# Patient Record
Sex: Male | Born: 1950 | Race: White | Hispanic: No | Marital: Married | Smoking: Former smoker
Health system: Southern US, Community
[De-identification: ages and names within clinical notes are randomized; demographics above are authoritative.]

## PROBLEM LIST (undated history)

## (undated) DIAGNOSIS — K219 Gastro-esophageal reflux disease without esophagitis: Secondary | ICD-10-CM

## (undated) DIAGNOSIS — Z8719 Personal history of other diseases of the digestive system: Secondary | ICD-10-CM

## (undated) DIAGNOSIS — J8410 Pulmonary fibrosis, unspecified: Secondary | ICD-10-CM

## (undated) DIAGNOSIS — C61 Malignant neoplasm of prostate: Secondary | ICD-10-CM

## (undated) DIAGNOSIS — E039 Hypothyroidism, unspecified: Secondary | ICD-10-CM

## (undated) HISTORY — PX: BRONCHOSCOPY: SUR163

## (undated) HISTORY — PX: BACK SURGERY: SHX140

---
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
## 2011-09-12 HISTORY — PX: TOTAL HIP ARTHROPLASTY: SHX124

## 2014-03-28 ENCOUNTER — Ambulatory Visit: Payer: Self-pay

## 2014-05-31 ENCOUNTER — Ambulatory Visit: Payer: Self-pay | Admitting: Emergency Medicine

## 2016-09-11 DIAGNOSIS — R42 Dizziness and giddiness: Secondary | ICD-10-CM

## 2016-09-11 HISTORY — DX: Dizziness and giddiness: R42

## 2018-08-26 ENCOUNTER — Encounter: Payer: Medicare Other | Attending: Internal Medicine

## 2018-08-26 ENCOUNTER — Other Ambulatory Visit: Payer: Self-pay

## 2018-08-26 VITALS — Ht 71.25 in | Wt 183.5 lb

## 2018-08-26 DIAGNOSIS — R0602 Shortness of breath: Secondary | ICD-10-CM | POA: Diagnosis not present

## 2018-08-26 DIAGNOSIS — J84112 Idiopathic pulmonary fibrosis: Secondary | ICD-10-CM | POA: Insufficient documentation

## 2018-08-26 NOTE — Progress Notes (Signed)
Daily Session Note  Patient Details  Name: Sekai Gitlin MRN: [id number] Date of Birth: 08/25/1951 Referring Provider:     Pulmonary Rehab from 08/26/2018 in [facility]  Referring Provider  Rosana Berger MD      Encounter Date: 08/26/2018  Check In: Session Check In - 08/26/18 1420      Check-In   Supervising physician immediately available to respond to emergencies  [facility] immediately available ER MD    Physician(s)  Dr. Joni Fears and Alfred Levins    Location  [facility]-Cardiac & Pulmonary Rehab    Staff Present  Justin Mend RCP,RRT,BSRT;Jessica Luan Pulling, MA, RCEP, CCRP, Exercise Physiologist    Medication changes reported      No    Fall or balance concerns reported     No    Warm-up and Cool-down  Not performed (comment)   medical evaluation   Resistance Training Performed  No    VAD Patient?  No    PAD/SET Patient?  No      Pain Assessment   Currently in Pain?  No/denies        Exercise Prescription Changes - 08/26/18 1500      Response to Exercise   Blood Pressure (Admit)  126/70    Blood Pressure (Exercise)  164/84    Blood Pressure (Exit)  126/72    Heart Rate (Admit)  107 bpm    Heart Rate (Exercise)  134 bpm    Heart Rate (Exit)  115 bpm    Oxygen Saturation (Admit)  95 %    Oxygen Saturation (Exercise)  87 %    Oxygen Saturation (Exit)  95 %    Rating of Perceived Exertion (Exercise)  12    Perceived Dyspnea (Exercise)  2    Symptoms  SOB    Comments  walk test results       Social History   Tobacco Use  Smoking Status Former Smoker  . Packs/day: 1.00  . Years: 30.00  . Pack years: 30.00  . Types: Cigarettes  . Last attempt to quit: 09/12/2007  . Years since quitting: 10.9  Smokeless Tobacco Former Systems developer  . Quit date: 09/11/2012    Goals Met:  Exercise tolerated well Personal goals reviewed Queuing for purse lip breathing No report of cardiac concerns or symptoms Strength training completed today  Goals Unmet:   Not Applicable  Comments:  6 Minute Walk    Row Name 08/26/18 1458         6 Minute Walk   Phase  Initial     Distance  1410 feet     Walk Time  6 minutes     # of Rest Breaks  0     MPH  2.67     METS  4.06     RPE  12     Perceived Dyspnea   2     VO2 Peak  14.22     Symptoms  Yes (comment)     Comments  SOB     Resting HR  107 bpm     Resting BP  126/70     Resting Oxygen Saturation   95 %     Exercise Oxygen Saturation  during 6 min walk  87 %     Max Ex. HR  134 bpm     Max Ex. BP  164/84     2 Minute Post BP  126/72       Interval HR   1  Minute HR  110     2 Minute HR  109     3 Minute HR  109     4 Minute HR  109     5 Minute HR  109     6 Minute HR  134     2 Minute Post HR  115     Interval Heart Rate?  Yes       Interval Oxygen   Interval Oxygen?  Yes     Baseline Oxygen Saturation %  95 %     1 Minute Oxygen Saturation %  92 %     1 Minute Liters of Oxygen  0 L Room Air     2 Minute Oxygen Saturation %  88 %     2 Minute Liters of Oxygen  0 L     3 Minute Oxygen Saturation %  88 %     3 Minute Liters of Oxygen  0 L     4 Minute Oxygen Saturation %  87 %     4 Minute Liters of Oxygen  0 L     5 Minute Oxygen Saturation %  88 %     5 Minute Liters of Oxygen  0 L     6 Minute Oxygen Saturation %  87 %     6 Minute Liters of Oxygen  0 L     2 Minute Post Oxygen Saturation %  95 %     2 Minute Post Liters of Oxygen  0 L       Service Time 1415-1500   Dr. Emily Filbert is Medical Director for [place] and [facility].

## 2018-08-26 NOTE — Patient Instructions (Signed)
Patient Instructions  Patient Details  Name: Caleb IvoryRobert Edward Townsend MRN: [id number] Date of Birth: 1950/12/20 Referring Provider:  Emmit PomfretSwaminathan, Aparna C, * Below are your personal goals for exercise, nutrition, and risk factors. Our goal is to help you stay on track towards obtaining and maintaining these goals. We will be discussing your progress on these goals with you throughout the program. Initial Exercise Prescription: Initial Exercise Prescription - 08/26/18 1500      Date of Initial Exercise RX and Referring Provider   Date  08/26/18    Referring Provider  Lance MorinSwaminathan, Aparna MD      Treadmill   MPH  2.5    Grade  1    Minutes  15    METs  3.26      Recumbant Elliptical   Level  2    RPM  50    Minutes  15    METs  3      T5 Nustep   Level  3    SPM  80    Minutes  15    METs  3.2      Prescription Details   Frequency (times per week)  3    Duration  Progress to 45 minutes of aerobic exercise without signs/symptoms of physical distress      Intensity   THRR 40-80% of Max Heartrate  128/143    Ratings of Perceived Exertion  11-13    Perceived Dyspnea  0-4      Progression   Progression  Continue to progress workloads to maintain intensity without signs/symptoms of physical distress.      Resistance Training   Training Prescription  Yes    Weight  4 lbs    Reps  10-15      Exercise Goals: Frequency: Be able to perform aerobic exercise two to three times per week in program working toward 2-5 days per week of home exercise.  Intensity: Work with a perceived exertion of 11 (fairly light) - 15 (hard) while following your exercise prescription.  We will make changes to your prescription with you as you progress through the program.   Duration: Be able to do 30 to 45 minutes of continuous aerobic exercise in addition to a 5 minute warm-up and a 5 minute cool-down routine. Nutrition Goals: Your personal nutrition goals will be established when you do your  nutrition analysis with the dietician. The following are general nutrition guidelines to follow: Cholesterol < 200mg /day Sodium < 1500mg /day Fiber: Men over 50 yrs - 30 grams per day Personal Goals: Personal Goals and Risk Factors at Admission - 08/26/18 1458      Core Components/Risk Factors/Patient Goals on Admission    Weight Management  Yes;Weight Loss    Intervention  Weight Management: Develop a combined nutrition and exercise program designed to reach desired caloric intake, while maintaining appropriate intake of nutrient and fiber, sodium and fats, and appropriate energy expenditure required for the weight goal.;Weight Management: Provide education and appropriate resources to help participant work on and attain dietary goals.;Weight Management/Obesity: Establish reasonable short term and long term weight goals.    Admit Weight  183 lb 8 oz (83.2 kg)    Goal Weight: Short Term  178 lb (80.7 kg)    Goal Weight: Long Term  170 lb (77.1 kg)    Expected Outcomes  Short Term: Continue to assess and modify interventions until short term weight is achieved;Long Term: Adherence to nutrition and physical activity/exercise program aimed toward attainment of  established weight goal;Weight Loss: Understanding of general recommendations for a balanced deficit meal plan, which promotes 1-2 lb weight loss per week and includes a negative energy balance of [phone] kcal/d;Understanding recommendations for meals to include 15-35% energy as protein, 25-35% energy from fat, 35-60% energy from carbohydrates, less than 200mg  of dietary cholesterol, 20-35 gm of total fiber daily;Understanding of distribution of calorie intake throughout the day with the consumption of 4-5 meals/snacks    Improve shortness of breath with ADL's  Yes    Intervention  Provide education, individualized exercise plan and daily activity instruction to help decrease symptoms of SOB with activities of daily living.    Expected Outcomes   Short Term: Improve cardiorespiratory fitness to achieve a reduction of symptoms when performing ADLs;Long Term: Be able to perform more ADLs without symptoms or delay the onset of symptoms      Tobacco Use Initial Evaluation: Social History   Tobacco Use  Smoking Status Former Smoker  . Packs/day: 1.00  . Years: 30.00  . Pack years: 30.00  . Types: Cigarettes  . Last attempt to quit: 09/12/2007  . Years since quitting: 10.9  Smokeless Tobacco Former Neurosurgeon  . Quit date: 09/11/2012   Exercise Goals and Review: Exercise Goals    Row Name 08/26/18 1502             Exercise Goals   Increase Physical Activity  Yes       Intervention  Provide advice, education, support and counseling about physical activity/exercise needs.;Develop an individualized exercise prescription for aerobic and resistive training based on initial evaluation findings, risk stratification, comorbidities and participant's personal goals.       Expected Outcomes  Short Term: Attend rehab on a regular basis to increase amount of physical activity.;Long Term: Add in home exercise to make exercise part of routine and to increase amount of physical activity.;Long Term: Exercising regularly at least 3-5 days a week.       Increase Strength and Stamina  Yes       Intervention  Provide advice, education, support and counseling about physical activity/exercise needs.;Develop an individualized exercise prescription for aerobic and resistive training based on initial evaluation findings, risk stratification, comorbidities and participant's personal goals.       Expected Outcomes  Short Term: Increase workloads from initial exercise prescription for resistance, speed, and METs.;Short Term: Perform resistance training exercises routinely during rehab and add in resistance training at home;Long Term: Improve cardiorespiratory fitness, muscular endurance and strength as measured by increased METs and functional capacity ( )       Able  to understand and use rate of perceived exertion (RPE) scale  Yes       Intervention  Provide education and explanation on how to use RPE scale       Expected Outcomes  Short Term: Able to use RPE daily in rehab to express subjective intensity level;Long Term:  Able to use RPE to guide intensity level when exercising independently       Knowledge and understanding of Target Heart Rate Range (THRR)  Yes       Intervention  Provide education and explanation of THRR including how the numbers were predicted and where they are located for reference       Expected Outcomes  Short Term: Able to state/look up THRR;Long Term: Able to use THRR to govern intensity when exercising independently;Short Term: Able to use daily as guideline for intensity in rehab       Able to  check pulse independently  Yes       Intervention  Provide education and demonstration on how to check pulse in carotid and radial arteries.;Review the importance of being able to check your own pulse for safety during independent exercise       Expected Outcomes  Short Term: Able to explain why pulse checking is important during independent exercise;Long Term: Able to check pulse independently and accurately       Understanding of Exercise Prescription  Yes       Intervention  Provide education, explanation, and written materials on patient's individual exercise prescription       Expected Outcomes  Short Term: Able to explain program exercise prescription;Long Term: Able to explain home exercise prescription to exercise independently         Copy of goals given to participant.

## 2018-08-26 NOTE — Progress Notes (Signed)
Pulmonary Individual Treatment Plan  Patient Details  Name: Caleb Townsend MRN: [id number] Date of Birth: Sep 02, 1951 Referring Provider:     Pulmonary Rehab from 08/26/2018 in [facility] Cardiac and Pulmonary Rehab  Referring Provider  Rosana Berger MD      Initial Encounter Date:    Pulmonary Rehab from 08/26/2018 in [facility] Cardiac and Pulmonary Rehab  Date  08/26/18      Visit Diagnosis: IPF (idiopathic pulmonary fibrosis) ([place])  Patient's Home Medications on Admission: No current outpatient medications on file.  Past Medical History: History reviewed. No pertinent past medical history.  Tobacco Use: Social History   Tobacco Use  Smoking Status Former Smoker  . Packs/day: 1.00  . Years: 30.00  . Pack years: 30.00  . Types: Cigarettes  . Last attempt to quit: 09/12/2007  . Years since quitting: 10.9  Smokeless Tobacco Former Systems developer  . Quit date: 09/11/2012    Labs: Recent Review Flowsheet Data    There is no flowsheet data to display.       Pulmonary Assessment Scores: Pulmonary Assessment Scores    Row Name 08/26/18 1431         ADL UCSD   ADL Phase  Entry     SOB Score total  51     Rest  0     Walk  2     Stairs  4     Bath  3     Dress  2     Shop  1       CAT Score   CAT Score  24       mMRC Score   mMRC Score  2        Pulmonary Function Assessment: Pulmonary Function Assessment - 08/26/18 1452      Breath   Bilateral Breath Sounds  Clear;Decreased    Shortness of Breath  Yes;Limiting activity       Exercise Target Goals: Exercise Program Goal: Individual exercise prescription set using results from initial 6 min walk test and THRR while considering  patient's activity barriers and safety.   Exercise Prescription Goal: Initial exercise prescription builds to 30-45 minutes a day of aerobic activity, 2-3 days per week.  Home exercise guidelines will be given to patient during program as part of exercise prescription that the  participant will acknowledge.  Activity Barriers & Risk Stratification: Activity Barriers & Cardiac Risk Stratification - 08/26/18 1459      Activity Barriers & Cardiac Risk Stratification   Activity Barriers  Deconditioning;Shortness of Breath;Back Problems   previous back surgery x2      6 Minute Walk: 6 Minute Walk    Row Name 08/26/18 1458         6 Minute Walk   Phase  Initial     Distance  1410 feet     Walk Time  6 minutes     # of Rest Breaks  0     MPH  2.67     METS  4.06     RPE  12     Perceived Dyspnea   2     VO2 Peak  14.22     Symptoms  Yes (comment)     Comments  SOB     Resting HR  107 bpm     Resting BP  126/70     Resting Oxygen Saturation   95 %     Exercise Oxygen Saturation  during 6 min walk  87 %  Max Ex. HR  134 bpm     Max Ex. BP  164/84     2 Minute Post BP  126/72       Interval HR   1 Minute HR  110     2 Minute HR  109     3 Minute HR  109     4 Minute HR  109     5 Minute HR  109     6 Minute HR  134     2 Minute Post HR  115     Interval Heart Rate?  Yes       Interval Oxygen   Interval Oxygen?  Yes     Baseline Oxygen Saturation %  95 %     1 Minute Oxygen Saturation %  92 %     1 Minute Liters of Oxygen  0 L Room Air     2 Minute Oxygen Saturation %  88 %     2 Minute Liters of Oxygen  0 L     3 Minute Oxygen Saturation %  88 %     3 Minute Liters of Oxygen  0 L     4 Minute Oxygen Saturation %  87 %     4 Minute Liters of Oxygen  0 L     5 Minute Oxygen Saturation %  88 %     5 Minute Liters of Oxygen  0 L     6 Minute Oxygen Saturation %  87 %     6 Minute Liters of Oxygen  0 L     2 Minute Post Oxygen Saturation %  95 %     2 Minute Post Liters of Oxygen  0 L       Oxygen Initial Assessment: Oxygen Initial Assessment - 08/26/18 1452      Home Oxygen   Home Oxygen Device  None    Sleep Oxygen Prescription  None    Home Exercise Oxygen Prescription  None    Home at Rest Exercise Oxygen Prescription  None       Initial 6 min Walk   Oxygen Used  None      Program Oxygen Prescription   Program Oxygen Prescription  None      Intervention   Short Term Goals  To learn and demonstrate proper use of respiratory medications;To learn and demonstrate proper pursed lip breathing techniques or other breathing techniques.;To learn and understand importance of maintaining oxygen saturations>88%;To learn and understand importance of monitoring SPO2 with pulse oximeter and demonstrate accurate use of the pulse oximeter.    Long  Term Goals  Maintenance of O2 saturations>88%;Verbalizes importance of monitoring SPO2 with pulse oximeter and return demonstration;Exhibits proper breathing techniques, such as pursed lip breathing or other method taught during program session;Compliance with respiratory medication;Demonstrates proper use of MDI's       Oxygen Re-Evaluation:   Oxygen Discharge (Final Oxygen Re-Evaluation):   Initial Exercise Prescription: Initial Exercise Prescription - 08/26/18 1500      Date of Initial Exercise RX and Referring Provider   Date  08/26/18    Referring Provider  Rosana Berger MD      Treadmill   MPH  2.5    Grade  1    Minutes  15    METs  3.26      Recumbant Elliptical   Level  2    RPM  50    Minutes  15  METs  3      T5 Nustep   Level  3    SPM  80    Minutes  15    METs  3.2      Prescription Details   Frequency (times per week)  3    Duration  Progress to 45 minutes of aerobic exercise without signs/symptoms of physical distress      Intensity   THRR 40-80% of Max Heartrate  128/143    Ratings of Perceived Exertion  11-13    Perceived Dyspnea  0-4      Progression   Progression  Continue to progress workloads to maintain intensity without signs/symptoms of physical distress.      Resistance Training   Training Prescription  Yes    Weight  4 lbs    Reps  10-15       Perform Capillary Blood Glucose checks as needed.  Exercise  Prescription Changes: Exercise Prescription Changes    Row Name 08/26/18 1500             Response to Exercise   Blood Pressure (Admit)  126/70       Blood Pressure (Exercise)  164/84       Blood Pressure (Exit)  126/72       Heart Rate (Admit)  107 bpm       Heart Rate (Exercise)  134 bpm       Heart Rate (Exit)  115 bpm       Oxygen Saturation (Admit)  95 %       Oxygen Saturation (Exercise)  87 %       Oxygen Saturation (Exit)  95 %       Rating of Perceived Exertion (Exercise)  12       Perceived Dyspnea (Exercise)  2       Symptoms  SOB       Comments  walk test results          Exercise Comments:   Exercise Goals and Review: Exercise Goals    Row Name 08/26/18 1502             Exercise Goals   Increase Physical Activity  Yes       Intervention  Provide advice, education, support and counseling about physical activity/exercise needs.;Develop an individualized exercise prescription for aerobic and resistive training based on initial evaluation findings, risk stratification, comorbidities and participant's personal goals.       Expected Outcomes  Short Term: Attend rehab on a regular basis to increase amount of physical activity.;Long Term: Add in home exercise to make exercise part of routine and to increase amount of physical activity.;Long Term: Exercising regularly at least 3-5 days a week.       Increase Strength and Stamina  Yes       Intervention  Provide advice, education, support and counseling about physical activity/exercise needs.;Develop an individualized exercise prescription for aerobic and resistive training based on initial evaluation findings, risk stratification, comorbidities and participant's personal goals.       Expected Outcomes  Short Term: Increase workloads from initial exercise prescription for resistance, speed, and METs.;Short Term: Perform resistance training exercises routinely during rehab and add in resistance training at home;Long Term:  Improve cardiorespiratory fitness, muscular endurance and strength as measured by increased METs and functional capacity (6MWT)       Able to understand and use rate of perceived exertion (RPE) scale  Yes       Intervention  Provide education and explanation on how to use RPE scale       Expected Outcomes  Short Term: Able to use RPE daily in rehab to express subjective intensity level;Long Term:  Able to use RPE to guide intensity level when exercising independently       Knowledge and understanding of Target Heart Rate Range (THRR)  Yes       Intervention  Provide education and explanation of THRR including how the numbers were predicted and where they are located for reference       Expected Outcomes  Short Term: Able to state/look up THRR;Long Term: Able to use THRR to govern intensity when exercising independently;Short Term: Able to use daily as guideline for intensity in rehab       Able to check pulse independently  Yes       Intervention  Provide education and demonstration on how to check pulse in carotid and radial arteries.;Review the importance of being able to check your own pulse for safety during independent exercise       Expected Outcomes  Short Term: Able to explain why pulse checking is important during independent exercise;Long Term: Able to check pulse independently and accurately       Understanding of Exercise Prescription  Yes       Intervention  Provide education, explanation, and written materials on patient's individual exercise prescription       Expected Outcomes  Short Term: Able to explain program exercise prescription;Long Term: Able to explain home exercise prescription to exercise independently          Exercise Goals Re-Evaluation :   Discharge Exercise Prescription (Final Exercise Prescription Changes): Exercise Prescription Changes - 08/26/18 1500      Response to Exercise   Blood Pressure (Admit)  126/70    Blood Pressure (Exercise)  164/84    Blood  Pressure (Exit)  126/72    Heart Rate (Admit)  107 bpm    Heart Rate (Exercise)  134 bpm    Heart Rate (Exit)  115 bpm    Oxygen Saturation (Admit)  95 %    Oxygen Saturation (Exercise)  87 %    Oxygen Saturation (Exit)  95 %    Rating of Perceived Exertion (Exercise)  12    Perceived Dyspnea (Exercise)  2    Symptoms  SOB    Comments  walk test results       Nutrition:  Target Goals: Understanding of nutrition guidelines, daily intake of sodium <1556m, cholesterol <2078m calories 30% from fat and 7% or less from saturated fats, daily to have 5 or more servings of fruits and vegetables.  Biometrics: Pre Biometrics - 08/26/18 1502      Pre Biometrics   Height  5' 11.25" (1.81 m)    Weight  183 lb 8 oz (83.2 kg)    Waist Circumference  34 inches    Hip Circumference  39 inches    Waist to Hip Ratio  0.87 %    BMI (Calculated)  25.41        Nutrition Therapy Plan and Nutrition Goals: Nutrition Therapy & Goals - 08/26/18 1455      Personal Nutrition Goals   Nutrition Goal  Lose a little weight    Personal Goal #2  Eat healtier    Comments  He would like to meet with the dietician. He states he eats fairly well. He has to eat with his medications and his appetite is good.  Intervention Plan   Intervention  Prescribe, educate and counsel regarding individualized specific dietary modifications aiming towards targeted core components such as weight, hypertension, lipid management, diabetes, heart failure and other comorbidities.;Nutrition handout(s) given to patient.    Expected Outcomes  Short Term Goal: Understand basic principles of dietary content, such as calories, fat, sodium, cholesterol and nutrients.;Long Term Goal: Adherence to prescribed nutrition plan.       Nutrition Assessments: Nutrition Assessments - 08/26/18 1434      MEDFICTS Scores   Pre Score  43       Nutrition Goals Re-Evaluation:   Nutrition Goals Discharge (Final Nutrition Goals  Re-Evaluation):   Psychosocial: Target Goals: Acknowledge presence or absence of significant depression and/or stress, maximize coping skills, provide positive support system. Participant is able to verbalize types and ability to use techniques and skills needed for reducing stress and depression.   Initial Review & Psychosocial Screening: Initial Psych Review & Screening - 08/26/18 1453      Initial Review   Current issues with  None Identified      Family Dynamics   Good Support System?  Yes    Comments  He can look to his wife,2 daughters for support and close friends.      Barriers   Psychosocial barriers to participate in program  There are no identifiable barriers or psychosocial needs.;The patient should benefit from training in stress management and relaxation.      Screening Interventions   Interventions  Encouraged to exercise;Program counselor consult;To provide support and resources with identified psychosocial needs;Provide feedback about the scores to participant    Expected Outcomes  Short Term goal: Utilizing psychosocial counselor, staff and physician to assist with identification of specific Stressors or current issues interfering with healing process. Setting desired goal for each stressor or current issue identified.;Long Term Goal: Stressors or current issues are controlled or eliminated.;Short Term goal: Identification and review with participant of any Quality of Life or Depression concerns found by scoring the questionnaire.;Long Term goal: The participant improves quality of Life and PHQ9 Scores as seen by post scores and/or verbalization of changes       Quality of Life Scores:  Scores of 19 and below usually indicate a poorer quality of life in these areas.  A difference of  2-3 points is a clinically meaningful difference.  A difference of 2-3 points in the total score of the Quality of Life Index has been associated with significant improvement in overall  quality of life, self-image, physical symptoms, and general health in studies assessing change in quality of life.  PHQ-9: Recent Review Flowsheet Data    Depression screen [facility] 2/9 08/26/2018   Decreased Interest 1   Down, Depressed, Hopeless 0   PHQ - 2 Score 1   Altered sleeping 1   Tired, decreased energy 2   Change in appetite 0   Feeling bad or failure about yourself  0   Trouble concentrating 0   Moving slowly or fidgety/restless 0   Suicidal thoughts 0   PHQ-9 Score 4   Difficult doing work/chores Somewhat difficult     Interpretation of Total Score  Total Score Depression Severity:  1-4 = Minimal depression, 5-9 = Mild depression, 10-14 = Moderate depression, 15-19 = Moderately severe depression, 20-27 = Severe depression   Psychosocial Evaluation and Intervention:   Psychosocial Re-Evaluation:   Psychosocial Discharge (Final Psychosocial Re-Evaluation):   Education: Education Goals: Education classes will be provided on a weekly basis, covering required  topics. Participant will state understanding/return demonstration of topics presented.  Learning Barriers/Preferences: Learning Barriers/Preferences - 08/26/18 1456      Learning Barriers/Preferences   Learning Barriers  None    Learning Preferences  None       Education Topics:  Initial Evaluation Education: - Verbal, written and demonstration of respiratory meds, oximetry and breathing techniques. Instruction on use of nebulizers and MDIs and importance of monitoring MDI activations.   Pulmonary Rehab from 08/26/2018 in [facility] Cardiac and Pulmonary Rehab  Date  08/26/18  Educator  [facility]  Instruction Review Code  1- Verbalizes Understanding      General Nutrition Guidelines/Fats and Fiber: -Group instruction provided by verbal, written material, models and posters to present the general guidelines for heart healthy nutrition. Gives an explanation and review of dietary fats and fiber.   Controlling  Sodium/Reading Food Labels: -Group verbal and written material supporting the discussion of sodium use in heart healthy nutrition. Review and explanation with models, verbal and written materials for utilization of the food label.   Exercise Physiology & General Exercise Guidelines: - Group verbal and written instruction with models to review the exercise physiology of the cardiovascular system and associated critical values. Provides general exercise guidelines with specific guidelines to those with heart or lung disease.    Aerobic Exercise & Resistance Training: - Gives group verbal and written instruction on the various components of exercise. Focuses on aerobic and resistive training programs and the benefits of this training and how to safely progress through these programs.   Flexibility, Balance, Mind/Body Relaxation: Provides group verbal/written instruction on the benefits of flexibility and balance training, including mind/body exercise modes such as yoga, pilates and tai chi.  Demonstration and skill practice provided.   Stress and Anxiety: - Provides group verbal and written instruction about the health risks of elevated stress and causes of high stress.  Discuss the correlation between heart/lung disease and anxiety and treatment options. Review healthy ways to manage with stress and anxiety.   Depression: - Provides group verbal and written instruction on the correlation between heart/lung disease and depressed mood, treatment options, and the stigmas associated with seeking treatment.   Exercise & Equipment Safety: - Individual verbal instruction and demonstration of equipment use and safety with use of the equipment.   Pulmonary Rehab from 08/26/2018 in [facility] Cardiac and Pulmonary Rehab  Date  08/26/18  Educator  [facility]  Instruction Review Code  1- Verbalizes Understanding      Infection Prevention: - Provides verbal and written material to individual with discussion of  infection control including proper hand washing and proper equipment cleaning during exercise session.   Pulmonary Rehab from 08/26/2018 in [facility] Cardiac and Pulmonary Rehab  Date  08/26/18  Educator  [facility]  Instruction Review Code  1- Verbalizes Understanding      Falls Prevention: - Provides verbal and written material to individual with discussion of falls prevention and safety.   Pulmonary Rehab from 08/26/2018 in [facility] Cardiac and Pulmonary Rehab  Date  08/26/18  Educator  [facility]  Instruction Review Code  1- Verbalizes Understanding      Diabetes: - Individual verbal and written instruction to review signs/symptoms of diabetes, desired ranges of glucose level fasting, after meals and with exercise. Advice that pre and post exercise glucose checks will be done for 3 sessions at entry of program.   Chronic Lung Diseases: - Group verbal and written instruction to review updates, respiratory medications, advancements in procedures and treatments. Discuss use  of supplemental oxygen including available portable oxygen systems, continuous and intermittent flow rates, concentrators, personal use and safety guidelines. Review proper use of inhaler and spacers. Provide informative websites for self-education.    Energy Conservation: - Provide group verbal and written instruction for methods to conserve energy, plan and organize activities. Instruct on pacing techniques, use of adaptive equipment and posture/positioning to relieve shortness of breath.   Triggers and Exacerbations: - Group verbal and written instruction to review types of environmental triggers and ways to prevent exacerbations. Discuss weather changes, air quality and the benefits of nasal washing. Review warning signs and symptoms to help prevent infections. Discuss techniques for effective airway clearance, coughing, and vibrations.   AED/CPR: - Group verbal and written instruction with the use of models to demonstrate the basic  use of the AED with the basic ABC's of resuscitation.   Anatomy and Physiology of the Lungs: - Group verbal and written instruction with the use of models to provide basic lung anatomy and physiology related to function, structure and complications of lung disease.   Anatomy & Physiology of the Heart: - Group verbal and written instruction and models provide basic cardiac anatomy and physiology, with the coronary electrical and arterial systems. Review of Valvular disease and Heart Failure   Cardiac Medications: - Group verbal and written instruction to review commonly prescribed medications for heart disease. Reviews the medication, class of the drug, and side effects.   Know Your Numbers and Risk Factors: -Group verbal and written instruction about important numbers in your health.  Discussion of what are risk factors and how they play a role in the disease process.  Review of Cholesterol, Blood Pressure, Diabetes, and BMI and the role they play in your overall health.   Sleep Hygiene: -Provides group verbal and written instruction about how sleep can affect your health.  Define sleep hygiene, discuss sleep cycles and impact of sleep habits. Review good sleep hygiene tips.    Other: -Provides group and verbal instruction on various topics (see comments)    Knowledge Questionnaire Score: Knowledge Questionnaire Score - 08/26/18 1433      Knowledge Questionnaire Score   Pre Score  15/18   reviewed with patient       Core Components/Risk Factors/Patient Goals at Admission: Personal Goals and Risk Factors at Admission - 08/26/18 1458      Core Components/Risk Factors/Patient Goals on Admission    Weight Management  Yes;Weight Loss    Intervention  Weight Management: Develop a combined nutrition and exercise program designed to reach desired caloric intake, while maintaining appropriate intake of nutrient and fiber, sodium and fats, and appropriate energy expenditure required  for the weight goal.;Weight Management: Provide education and appropriate resources to help participant work on and attain dietary goals.;Weight Management/Obesity: Establish reasonable short term and long term weight goals.    Admit Weight  183 lb 8 oz (83.2 kg)    Goal Weight: Short Term  178 lb (80.7 kg)    Goal Weight: Long Term  170 lb (77.1 kg)    Expected Outcomes  Short Term: Continue to assess and modify interventions until short term weight is achieved;Long Term: Adherence to nutrition and physical activity/exercise program aimed toward attainment of established weight goal;Weight Loss: Understanding of general recommendations for a balanced deficit meal plan, which promotes 1-2 lb weight loss per week and includes a negative energy balance of [phone] kcal/d;Understanding recommendations for meals to include 15-35% energy as protein, 25-35% energy from fat, 35-60% energy  from carbohydrates, less than 259m of dietary cholesterol, 20-35 gm of total fiber daily;Understanding of distribution of calorie intake throughout the day with the consumption of 4-5 meals/snacks    Improve shortness of breath with ADL's  Yes    Intervention  Provide education, individualized exercise plan and daily activity instruction to help decrease symptoms of SOB with activities of daily living.    Expected Outcomes  Short Term: Improve cardiorespiratory fitness to achieve a reduction of symptoms when performing ADLs;Long Term: Be able to perform more ADLs without symptoms or delay the onset of symptoms       Core Components/Risk Factors/Patient Goals Review:    Core Components/Risk Factors/Patient Goals at Discharge (Final Review):    ITP Comments: ITP Comments    Row Name 08/26/18 1425           ITP Comments  Medical Evaluation completed. Chart sent for review and changes to Dr. MEmily FilbertDirector of [place]asa [place] Diagnosis can be found in Care everywhere 08/15/18          Comments: Initial ITP

## 2018-09-16 ENCOUNTER — Encounter: Payer: Medicare Other | Attending: Internal Medicine | Admitting: *Deleted

## 2018-09-16 DIAGNOSIS — J84112 Idiopathic pulmonary fibrosis: Secondary | ICD-10-CM | POA: Diagnosis not present

## 2018-09-16 DIAGNOSIS — R0602 Shortness of breath: Secondary | ICD-10-CM | POA: Diagnosis not present

## 2018-09-16 NOTE — Progress Notes (Signed)
Daily Session Note  Patient Details  Name: Caleb Townsend MRN: [id number] Date of Birth: 02/07/1951 Referring Provider:     Pulmonary Rehab from 08/26/2018 in [facility] Cardiac and Pulmonary Rehab  Referring Provider  Rosana Berger MD      Encounter Date: 09/16/2018  Check In: Session Check In - 09/16/18 1022      Check-In   Supervising physician immediately available to respond to emergencies  [facility] immediately available ER MD    Physician(s)  Dr. Joni Fears and Dr. Kerman Passey    Location  [facility]-Cardiac & Pulmonary Rehab    Staff Present  Earlean Shawl, BS, ACSM CEP, Exercise Physiologist;Joseph [facility], [place], ACSM CEP, Exercise Physiologist    Medication changes reported      No    Fall or balance concerns reported     No    Tobacco Cessation  No Change    Warm-up and Cool-down  Performed as group-led instruction    Resistance Training Performed  Yes    VAD Patient?  No    PAD/SET Patient?  No      Pain Assessment   Currently in Pain?  No/denies    Multiple Pain Sites  No          Social History   Tobacco Use  Smoking Status Former Smoker  . Packs/day: 1.00  . Years: 30.00  . Pack years: 30.00  . Types: Cigarettes  . Last attempt to quit: 09/12/2007  . Years since quitting: 11.0  Smokeless Tobacco Former Systems developer  . Quit date: 09/11/2012    Goals Met:  Proper associated with RPD/PD & O2 Sat Exercise tolerated well Personal goals reviewed No report of cardiac concerns or symptoms Strength training completed today  Goals Unmet:  Not Applicable  Comments: First full day of exercise!  Patient was oriented to gym and equipment including functions, settings, policies, and procedures.  Patient's individual exercise prescription and treatment plan were reviewed.  All starting workloads were established based on the results of the 6 minute walk test done at initial orientation visit.  The plan for exercise progression was also introduced and  progression will be customized based on patient's performance and goals.    Dr. Emily Filbert is Medical Director for [place] and [facility].

## 2018-09-18 DIAGNOSIS — J84112 Idiopathic pulmonary fibrosis: Secondary | ICD-10-CM

## 2018-09-18 DIAGNOSIS — R0602 Shortness of breath: Secondary | ICD-10-CM | POA: Diagnosis not present

## 2018-09-18 NOTE — Progress Notes (Signed)
Daily Session Note  Patient Details  Name: Caleb Townsend MRN: [id number] Date of Birth: [birth year] Referring Provider:     Pulmonary Rehab from 08/26/2018 in [facility] Cardiac and Pulmonary Rehab  Referring Provider  Rosana Berger MD      Encounter Date: 09/18/2018  Check In: Session Check In - 09/18/18 1025      Check-In   Supervising physician immediately available to respond to emergencies  [facility] immediately available ER MD    Physician(s)  Jimmye Norman and Mariea Clonts    Location  [facility]-Cardiac & Pulmonary Rehab    Staff Present  Justin Mend RCP,RRT,BSRT;Amanda Oletta Darter, BA, ACSM CEP, Exercise Physiologist;Jessica Luan Pulling, MA, RCEP, CCRP, Exercise Physiologist    Medication changes reported      No    Fall or balance concerns reported     No    Warm-up and Cool-down  Performed as group-led instruction    Resistance Training Performed  Yes    VAD Patient?  No    PAD/SET Patient?  No      Pain Assessment   Currently in Pain?  No/denies    Multiple Pain Sites  No          Social History   Tobacco Use  Smoking Status Former Smoker  . Packs/day: 1.00  . Years: 30.00  . Pack years: 30.00  . Types: Cigarettes  . Last attempt to quit: 09/12/2007  . Years since quitting: 11.0  Smokeless Tobacco Former Systems developer  . Quit date: 09/11/2012    Goals Met:  Proper associated with RPD/PD & O2 Sat Independence with exercise equipment Exercise tolerated well Strength training completed today  Goals Unmet:  Not Applicable  Comments: Pt able to follow exercise prescription today without complaint.  Will continue to monitor for progression.    Dr. Emily Filbert is Medical Director for [place] and [facility].

## 2018-09-23 ENCOUNTER — Encounter: Payer: Medicare Other | Admitting: *Deleted

## 2018-09-23 DIAGNOSIS — R0602 Shortness of breath: Secondary | ICD-10-CM | POA: Diagnosis not present

## 2018-09-23 DIAGNOSIS — J84112 Idiopathic pulmonary fibrosis: Secondary | ICD-10-CM

## 2018-09-23 NOTE — Progress Notes (Signed)
Daily Session Note  Patient Details  Name: Caleb Townsend MRN: [id number] Date of Birth: [birth year] Referring Provider:     Pulmonary Rehab from 08/26/2018 in [facility] Cardiac and Pulmonary Rehab  Referring Provider  Rosana Berger MD      Encounter Date: 09/23/2018  Check In: Session Check In - 09/23/18 1026      Check-In   Supervising physician immediately available to respond to emergencies  [facility] immediately available ER MD    Physician(s)  Dr. Kerman Passey Dr. Cinda Quest    Location  [facility]-Cardiac & Pulmonary Rehab    Staff Present  Earlean Shawl, BS, ACSM CEP, Exercise Physiologist;Joseph [facility], [place], ACSM CEP, Exercise Physiologist    Medication changes reported      No    Fall or balance concerns reported     No    Tobacco Cessation  No Change    Warm-up and Cool-down  Performed as group-led instruction    Resistance Training Performed  Yes    VAD Patient?  No    PAD/SET Patient?  No      Pain Assessment   Currently in Pain?  No/denies    Multiple Pain Sites  No          Social History   Tobacco Use  Smoking Status Former Smoker  . Packs/day: 1.00  . Years: 30.00  . Pack years: 30.00  . Types: Cigarettes  . Last attempt to quit: 09/12/2007  . Years since quitting: 11.0  Smokeless Tobacco Former Systems developer  . Quit date: 09/11/2012    Goals Met:  Proper associated with RPD/PD & O2 Sat Independence with exercise equipment Personal goals reviewed No report of cardiac concerns or symptoms Strength training completed today  Goals Unmet:  Not Applicable  Comments: Pt able to follow exercise prescription today without complaint.  Will continue to monitor for progression.    Dr. Emily Filbert is Medical Director for [place] and [facility].

## 2018-09-23 NOTE — Progress Notes (Signed)
Pulmonary Individual Treatment Plan  Patient Details  Name: Caleb Townsend MRN: [id number] Date of Birth: 1951-08-18 Referring Provider:     Pulmonary Rehab from 08/26/2018 in [facility] Cardiac and Pulmonary Rehab  Referring Provider  Rosana Berger MD      Initial Encounter Date:    Pulmonary Rehab from 08/26/2018 in [facility] Cardiac and Pulmonary Rehab  Date  08/26/18      Visit Diagnosis: No diagnosis found.  Patient's Home Medications on Admission: No current outpatient medications on file.  Past Medical History: No past medical history on file.  Tobacco Use: Social History   Tobacco Use  Smoking Status Former Smoker  . Packs/day: 1.00  . Years: 30.00  . Pack years: 30.00  . Types: Cigarettes  . Last attempt to quit: 09/12/2007  . Years since quitting: 11.0  Smokeless Tobacco Former Systems developer  . Quit date: 09/11/2012    Labs: Recent Review Flowsheet Data    There is no flowsheet data to display.       Pulmonary Assessment Scores: Pulmonary Assessment Scores    Row Name 08/26/18 1431         ADL UCSD   ADL Phase  Entry     SOB Score total  51     Rest  0     Walk  2     Stairs  4     Bath  3     Dress  2     Shop  1       CAT Score   CAT Score  24       mMRC Score   mMRC Score  2        Pulmonary Function Assessment: Pulmonary Function Assessment - 08/26/18 1452      Breath   Bilateral Breath Sounds  Clear;Decreased    Shortness of Breath  Yes;Limiting activity       Exercise Target Goals: Exercise Program Goal: Individual exercise prescription set using results from initial 6 min walk test and THRR while considering  patient's activity barriers and safety.   Exercise Prescription Goal: Initial exercise prescription builds to 30-45 minutes a day of aerobic activity, 2-3 days per week.  Home exercise guidelines will be given to patient during program as part of exercise prescription that the participant will acknowledge.  Activity  Barriers & Risk Stratification: Activity Barriers & Cardiac Risk Stratification - 08/26/18 1459      Activity Barriers & Cardiac Risk Stratification   Activity Barriers  Deconditioning;Shortness of Breath;Back Problems   previous back surgery x2      6 Minute Walk: 6 Minute Walk    Row Name 08/26/18 1458         6 Minute Walk   Phase  Initial     Distance  1410 feet     Walk Time  6 minutes     # of Rest Breaks  0     MPH  2.67     METS  4.06     RPE  12     Perceived Dyspnea   2     VO2 Peak  14.22     Symptoms  Yes (comment)     Comments  SOB     Resting HR  107 bpm     Resting BP  126/70     Resting Oxygen Saturation   95 %     Exercise Oxygen Saturation  during 6 min walk  87 %  Max Ex. HR  134 bpm     Max Ex. BP  164/84     2 Minute Post BP  126/72       Interval HR   1 Minute HR  110     2 Minute HR  109     3 Minute HR  109     4 Minute HR  109     5 Minute HR  109     6 Minute HR  134     2 Minute Post HR  115     Interval Heart Rate?  Yes       Interval Oxygen   Interval Oxygen?  Yes     Baseline Oxygen Saturation %  95 %     1 Minute Oxygen Saturation %  92 %     1 Minute Liters of Oxygen  0 L Room Air     2 Minute Oxygen Saturation %  88 %     2 Minute Liters of Oxygen  0 L     3 Minute Oxygen Saturation %  88 %     3 Minute Liters of Oxygen  0 L     4 Minute Oxygen Saturation %  87 %     4 Minute Liters of Oxygen  0 L     5 Minute Oxygen Saturation %  88 %     5 Minute Liters of Oxygen  0 L     6 Minute Oxygen Saturation %  87 %     6 Minute Liters of Oxygen  0 L     2 Minute Post Oxygen Saturation %  95 %     2 Minute Post Liters of Oxygen  0 L       Oxygen Initial Assessment: Oxygen Initial Assessment - 08/26/18 1452      Home Oxygen   Home Oxygen Device  None    Sleep Oxygen Prescription  None    Home Exercise Oxygen Prescription  None    Home at Rest Exercise Oxygen Prescription  None      Initial 6 min Walk   Oxygen Used   None      Program Oxygen Prescription   Program Oxygen Prescription  None      Intervention   Short Term Goals  To learn and demonstrate proper use of respiratory medications;To learn and demonstrate proper pursed lip breathing techniques or other breathing techniques.;To learn and understand importance of maintaining oxygen saturations>88%;To learn and understand importance of monitoring SPO2 with pulse oximeter and demonstrate accurate use of the pulse oximeter.    Long  Term Goals  Maintenance of O2 saturations>88%;Verbalizes importance of monitoring SPO2 with pulse oximeter and return demonstration;Exhibits proper breathing techniques, such as pursed lip breathing or other method taught during program session;Compliance with respiratory medication;Demonstrates proper use of MDI's       Oxygen Re-Evaluation: Oxygen Re-Evaluation    Row Name 09/16/18 1025             Program Oxygen Prescription   Program Oxygen Prescription  None         Home Oxygen   Home Oxygen Device  None       Sleep Oxygen Prescription  None       Home Exercise Oxygen Prescription  None       Home at Rest Exercise Oxygen Prescription  None         Goals/Expected Outcomes   Short Term Goals  To learn and demonstrate proper use of respiratory medications;To learn and demonstrate proper pursed lip breathing techniques or other breathing techniques.;To learn and understand importance of maintaining oxygen saturations>88%;To learn and understand importance of monitoring SPO2 with pulse oximeter and demonstrate accurate use of the pulse oximeter.       Long  Term Goals  Maintenance of O2 saturations>88%;Verbalizes importance of monitoring SPO2 with pulse oximeter and return demonstration;Exhibits proper breathing techniques, such as pursed lip breathing or other method taught during program session;Compliance with respiratory medication;Demonstrates proper use of MDI's       Comments  Reviewed PLB technique with pt.   Talked about how it work and it's important to maintaining his exercise saturations.         Goals/Expected Outcomes  Short: Become more profiecient at using PLB.   Long: Become independent at using PLB.          Oxygen Discharge (Final Oxygen Re-Evaluation): Oxygen Re-Evaluation - 09/16/18 1025      Program Oxygen Prescription   Program Oxygen Prescription  None      Home Oxygen   Home Oxygen Device  None    Sleep Oxygen Prescription  None    Home Exercise Oxygen Prescription  None    Home at Rest Exercise Oxygen Prescription  None      Goals/Expected Outcomes   Short Term Goals  To learn and demonstrate proper use of respiratory medications;To learn and demonstrate proper pursed lip breathing techniques or other breathing techniques.;To learn and understand importance of maintaining oxygen saturations>88%;To learn and understand importance of monitoring SPO2 with pulse oximeter and demonstrate accurate use of the pulse oximeter.    Long  Term Goals  Maintenance of O2 saturations>88%;Verbalizes importance of monitoring SPO2 with pulse oximeter and return demonstration;Exhibits proper breathing techniques, such as pursed lip breathing or other method taught during program session;Compliance with respiratory medication;Demonstrates proper use of MDI's    Comments  Reviewed PLB technique with pt.  Talked about how it work and it's important to maintaining his exercise saturations.      Goals/Expected Outcomes  Short: Become more profiecient at using PLB.   Long: Become independent at using PLB.       Initial Exercise Prescription: Initial Exercise Prescription - 08/26/18 1500      Date of Initial Exercise RX and Referring Provider   Date  08/26/18    Referring Provider  Rosana Berger MD      Treadmill   MPH  2.5    Grade  1    Minutes  15    METs  3.26      Recumbant Elliptical   Level  2    RPM  50    Minutes  15    METs  3      T5 Nustep   Level  3    SPM  80     Minutes  15    METs  3.2      Prescription Details   Frequency (times per week)  3    Duration  Progress to 45 minutes of aerobic exercise without signs/symptoms of physical distress      Intensity   THRR 40-80% of Max Heartrate  128/143    Ratings of Perceived Exertion  11-13    Perceived Dyspnea  0-4      Progression   Progression  Continue to progress workloads to maintain intensity without signs/symptoms of physical distress.      Resistance Training  Training Prescription  Yes    Weight  4 lbs    Reps  10-15       Perform Capillary Blood Glucose checks as needed.  Exercise Prescription Changes: Exercise Prescription Changes    Row Name 08/26/18 1500             Response to Exercise   Blood Pressure (Admit)  126/70       Blood Pressure (Exercise)  164/84       Blood Pressure (Exit)  126/72       Heart Rate (Admit)  107 bpm       Heart Rate (Exercise)  134 bpm       Heart Rate (Exit)  115 bpm       Oxygen Saturation (Admit)  95 %       Oxygen Saturation (Exercise)  87 %       Oxygen Saturation (Exit)  95 %       Rating of Perceived Exertion (Exercise)  12       Perceived Dyspnea (Exercise)  2       Symptoms  SOB       Comments  walk test results          Exercise Comments: Exercise Comments    Row Name 09/16/18 1024           Exercise Comments   First full day of exercise!  Patient was oriented to gym and equipment including functions, settings, policies, and procedures.  Patient's individual exercise prescription and treatment plan were reviewed.  All starting workloads were established based on the results of the 6 minute walk test done at initial orientation visit.  The plan for exercise progression was also introduced and progression will be customized based on patient's performance and goals.          Exercise Goals and Review: Exercise Goals    Row Name 08/26/18 1502             Exercise Goals   Increase Physical Activity  Yes        Intervention  Provide advice, education, support and counseling about physical activity/exercise needs.;Develop an individualized exercise prescription for aerobic and resistive training based on initial evaluation findings, risk stratification, comorbidities and participant's personal goals.       Expected Outcomes  Short Term: Attend rehab on a regular basis to increase amount of physical activity.;Long Term: Add in home exercise to make exercise part of routine and to increase amount of physical activity.;Long Term: Exercising regularly at least 3-5 days a week.       Increase Strength and Stamina  Yes       Intervention  Provide advice, education, support and counseling about physical activity/exercise needs.;Develop an individualized exercise prescription for aerobic and resistive training based on initial evaluation findings, risk stratification, comorbidities and participant's personal goals.       Expected Outcomes  Short Term: Increase workloads from initial exercise prescription for resistance, speed, and METs.;Short Term: Perform resistance training exercises routinely during rehab and add in resistance training at home;Long Term: Improve cardiorespiratory fitness, muscular endurance and strength as measured by increased METs and functional capacity (6MWT)       Able to understand and use rate of perceived exertion (RPE) scale  Yes       Intervention  Provide education and explanation on how to use RPE scale       Expected Outcomes  Short Term: Able to use RPE daily in  rehab to express subjective intensity level;Long Term:  Able to use RPE to guide intensity level when exercising independently       Knowledge and understanding of Target Heart Rate Range (THRR)  Yes       Intervention  Provide education and explanation of THRR including how the numbers were predicted and where they are located for reference       Expected Outcomes  Short Term: Able to state/look up THRR;Long Term: Able to use  THRR to govern intensity when exercising independently;Short Term: Able to use daily as guideline for intensity in rehab       Able to check pulse independently  Yes       Intervention  Provide education and demonstration on how to check pulse in carotid and radial arteries.;Review the importance of being able to check your own pulse for safety during independent exercise       Expected Outcomes  Short Term: Able to explain why pulse checking is important during independent exercise;Long Term: Able to check pulse independently and accurately       Understanding of Exercise Prescription  Yes       Intervention  Provide education, explanation, and written materials on patient's individual exercise prescription       Expected Outcomes  Short Term: Able to explain program exercise prescription;Long Term: Able to explain home exercise prescription to exercise independently          Exercise Goals Re-Evaluation : Exercise Goals Re-Evaluation    Row Name 09/16/18 1024             Exercise Goal Re-Evaluation   Exercise Goals Review  Increase Physical Activity;Increase Strength and Stamina;Able to understand and use rate of perceived exertion (RPE) scale;Knowledge and understanding of Target Heart Rate Range (THRR);Understanding of Exercise Prescription;Able to understand and use Dyspnea scale       Comments  Reviewed RPE scale, THR and program prescription with pt today.  Pt voiced understanding and was given a copy of goals to take home.        Expected Outcomes  Short: Use RPE daily to regulate intensity. Long: Follow program prescription in THR.          Discharge Exercise Prescription (Final Exercise Prescription Changes): Exercise Prescription Changes - 08/26/18 1500      Response to Exercise   Blood Pressure (Admit)  126/70    Blood Pressure (Exercise)  164/84    Blood Pressure (Exit)  126/72    Heart Rate (Admit)  107 bpm    Heart Rate (Exercise)  134 bpm    Heart Rate (Exit)  115  bpm    Oxygen Saturation (Admit)  95 %    Oxygen Saturation (Exercise)  87 %    Oxygen Saturation (Exit)  95 %    Rating of Perceived Exertion (Exercise)  12    Perceived Dyspnea (Exercise)  2    Symptoms  SOB    Comments  walk test results       Nutrition:  Target Goals: Understanding of nutrition guidelines, daily intake of sodium <1517m, cholesterol <2066m calories 30% from fat and 7% or less from saturated fats, daily to have 5 or more servings of fruits and vegetables.  Biometrics: Pre Biometrics - 08/26/18 1502      Pre Biometrics   Height  5' 11.25" (1.81 m)    Weight  183 lb 8 oz (83.2 kg)    Waist Circumference  34 inches    Hip  Circumference  39 inches    Waist to Hip Ratio  0.87 %    BMI (Calculated)  25.41        Nutrition Therapy Plan and Nutrition Goals: Nutrition Therapy & Goals - 09/18/18 1357      Nutrition Therapy   Diet  TLC    Protein (specify units)  8oz    Fiber  35 grams    Whole Grain Foods  3 servings   does not typically choose whole grains   Saturated Fats  14 max. grams    Fruits and Vegetables  6 servings/day   8 ideal, tries to eat fruits/ vegetables daily   Sodium  1500 grams      Personal Nutrition Goals   Nutrition Goal  Continue to work on decreasing the amount of sugar added to beverages like sweet tea, great job for getting started with this!    Personal Goal #2  Select lean cuts / options for red meats since you eat them more often than most other protein sources    Comments  States he tries to eat a diet that is lower in fat and cholesterol. He has been using honey to sweeten things like coffee rather than table sugar and has been eating breakfast consistently d/t taking a new medication. Breakfast: cereal with 2% milk, yogurt, sometimes eggs / peanut butter. Lunch: (out 2-3x/wk) burger, [employer] sub, [place] sandwich on white bread, leftovers. Dinner: (out 1-2x/wk to [employer]) boston butt, squash, corn, steak, baked potato, salad,  fish (baked or fried occasionally), pork, shrimp, some chicken but does not prefer it, spaghetti with ground beef. Snacks: fruit, ice cream occasionally. Beverages: water, sweet tea, used to drink beer when golfing but has been unable to golf as of late. His wife does not cook with salt but he does add salt at the table      Intervention Plan   Intervention  Prescribe, educate and counsel regarding individualized specific dietary modifications aiming towards targeted core components such as weight, hypertension, lipid management, diabetes, heart failure and other comorbidities.    Expected Outcomes  Short Term Goal: Understand basic principles of dietary content, such as calories, fat, sodium, cholesterol and nutrients.;Short Term Goal: A plan has been developed with personal nutrition goals set during dietitian appointment.;Long Term Goal: Adherence to prescribed nutrition plan.       Nutrition Assessments: Nutrition Assessments - 08/26/18 1434      MEDFICTS Scores   Pre Score  43       Nutrition Goals Re-Evaluation: Nutrition Goals Re-Evaluation    Caleb Name 09/18/18 1144 09/18/18 1427           Goals   Nutrition Goal  Lose a little weight  Select lean cuts/ options for red meats since you eat them more often than most other protein sources      Comment  Patient would like to meet with the dietician. He wants to lose a little bit of weight. His diet is important since he has breathing issues and may be wise to find out if he is getting enough nutrition  He prefers red meats over chicken/ turkey/ seafood and eats them multiple times per week      Expected Outcome  Short: meet with dietician. Long: adhere to a diet plan.  He will choose lean meats overall, especially for red meats         Personal Goal #2 Re-Evaluation   Personal Goal #2  -  Continue to work on  decreasing the amount of sugar added to beverages like sweet tea, great job for getting started with this!         Nutrition  Goals Discharge (Final Nutrition Goals Re-Evaluation): Nutrition Goals Re-Evaluation - 09/18/18 1427      Goals   Nutrition Goal  Select lean cuts/ options for red meats since you eat them more often than most other protein sources    Comment  He prefers red meats over chicken/ turkey/ seafood and eats them multiple times per week    Expected Outcome  He will choose lean meats overall, especially for red meats       Personal Goal #2 Re-Evaluation   Personal Goal #2  Continue to work on decreasing the amount of sugar added to beverages like sweet tea, great job for getting started with this!       Psychosocial: Target Goals: Acknowledge presence or absence of significant depression and/or stress, maximize coping skills, provide positive support system. Participant is able to verbalize types and ability to use techniques and skills needed for reducing stress and depression.   Initial Review & Psychosocial Screening: Initial Psych Review & Screening - 08/26/18 1453      Initial Review   Current issues with  None Identified      Family Dynamics   Good Support System?  Yes    Comments  He can look to his wife,2 daughters for support and close friends.      Barriers   Psychosocial barriers to participate in program  There are no identifiable barriers or psychosocial needs.;The patient should benefit from training in stress management and relaxation.      Screening Interventions   Interventions  Encouraged to exercise;Program counselor consult;To provide support and resources with identified psychosocial needs;Provide feedback about the scores to participant    Expected Outcomes  Short Term goal: Utilizing psychosocial counselor, staff and physician to assist with identification of specific Stressors or current issues interfering with healing process. Setting desired goal for each stressor or current issue identified.;Long Term Goal: Stressors or current issues are controlled or  eliminated.;Short Term goal: Identification and review with participant of any Quality of Life or Depression concerns found by scoring the questionnaire.;Long Term goal: The participant improves quality of Life and PHQ9 Scores as seen by post scores and/or verbalization of changes       Quality of Life Scores:  Scores of 19 and below usually indicate a poorer quality of life in these areas.  A difference of  2-3 points is a clinically meaningful difference.  A difference of 2-3 points in the total score of the Quality of Life Index has been associated with significant improvement in overall quality of life, self-image, physical symptoms, and general health in studies assessing change in quality of life.  PHQ-9: Recent Review Flowsheet Data    Depression screen [facility] 2/9 08/26/2018   Decreased Interest 1   Down, Depressed, Hopeless 0   PHQ - 2 Score 1   Altered sleeping 1   Tired, decreased energy 2   Change in appetite 0   Feeling bad or failure about yourself  0   Trouble concentrating 0   Moving slowly or fidgety/restless 0   Suicidal thoughts 0   PHQ-9 Score 4   Difficult doing work/chores Somewhat difficult     Interpretation of Total Score  Total Score Depression Severity:  1-4 = Minimal depression, 5-9 = Mild depression, 10-14 = Moderate depression, 15-19 = Moderately severe depression, 20-27 =  Severe depression   Psychosocial Evaluation and Intervention: Psychosocial Evaluation - 09/16/18 1037      Psychosocial Evaluation & Interventions   Interventions  Stress management education;Encouraged to exercise with the program and follow exercise prescription    Comments  Counselor met with [facility]) today for initial psychsocial evaluation.  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  Mortimer Fries has a strong support system with a spouse of 79 years; (2) adult daughters in neighboring communities; good friends and active involvement in his local church.  He has a  history of prostate cancer in 2013 and has remained cancer free since that time.  Bobby sleeps well and has a good appetite.  He denies a history of depression or anxiety and is typically in a positive mood.  His health is his primary stressor at this time.  He has goals to improve his breathing so that he can possibly be a transplant recipient.  Mortimer Fries meets with a transplant team on 2/3 & 2/4 to be evaluated for this.  He will be followed by staff here.    Expected Outcomes  Short:  Mortimer Fries will exercise according to his plan to improve his breathing and increase his stamina in order to be a possible transplant recipient.  Long:  Mortimer Fries will continue to make positive self-care choices and maintain a positive attitude as he moves forward towards the transplant evaluation.      Continue Psychosocial Services   Follow up required by staff       Psychosocial Re-Evaluation:   Psychosocial Discharge (Final Psychosocial Re-Evaluation):   Education: Education Goals: Education classes will be provided on a weekly basis, covering required topics. Participant will state understanding/return demonstration of topics presented.  Learning Barriers/Preferences: Learning Barriers/Preferences - 08/26/18 1456      Learning Barriers/Preferences   Learning Barriers  None    Learning Preferences  None       Education Topics:  Initial Evaluation Education: - Verbal, written and demonstration of respiratory meds, oximetry and breathing techniques. Instruction on use of nebulizers and MDIs and importance of monitoring MDI activations.   Pulmonary Rehab from 09/18/2018 in [facility] Cardiac and Pulmonary Rehab  Date  08/26/18  Educator  [facility]  Instruction Review Code  1- Verbalizes Understanding      General Nutrition Guidelines/Fats and Fiber: -Group instruction provided by verbal, written material, models and posters to present the general guidelines for heart healthy nutrition. Gives an explanation and review of  dietary fats and fiber.   Controlling Sodium/Reading Food Labels: -Group verbal and written material supporting the discussion of sodium use in heart healthy nutrition. Review and explanation with models, verbal and written materials for utilization of the food label.   Exercise Physiology & General Exercise Guidelines: - Group verbal and written instruction with models to review the exercise physiology of the cardiovascular system and associated critical values. Provides general exercise guidelines with specific guidelines to those with heart or lung disease.    Aerobic Exercise & Resistance Training: - Gives group verbal and written instruction on the various components of exercise. Focuses on aerobic and resistive training programs and the benefits of this training and how to safely progress through these programs.   Flexibility, Balance, Mind/Body Relaxation: Provides group verbal/written instruction on the benefits of flexibility and balance training, including mind/body exercise modes such as yoga, pilates and tai chi.  Demonstration and skill practice provided.   Stress and Anxiety: - Provides group verbal and written  instruction about the health risks of elevated stress and causes of high stress.  Discuss the correlation between heart/lung disease and anxiety and treatment options. Review healthy ways to manage with stress and anxiety.   Depression: - Provides group verbal and written instruction on the correlation between heart/lung disease and depressed mood, treatment options, and the stigmas associated with seeking treatment.   Exercise & Equipment Safety: - Individual verbal instruction and demonstration of equipment use and safety with use of the equipment.   Pulmonary Rehab from 09/18/2018 in [facility] Cardiac and Pulmonary Rehab  Date  08/26/18  Educator  [facility]  Instruction Review Code  1- Verbalizes Understanding      Infection Prevention: - Provides verbal and written  material to individual with discussion of infection control including proper hand washing and proper equipment cleaning during exercise session.   Pulmonary Rehab from 09/18/2018 in [facility] Cardiac and Pulmonary Rehab  Date  08/26/18  Educator  [facility]  Instruction Review Code  1- Verbalizes Understanding      Falls Prevention: - Provides verbal and written material to individual with discussion of falls prevention and safety.   Pulmonary Rehab from 09/18/2018 in [facility] Cardiac and Pulmonary Rehab  Date  08/26/18  Educator  [facility]  Instruction Review Code  1- Verbalizes Understanding      Diabetes: - Individual verbal and written instruction to review signs/symptoms of diabetes, desired ranges of glucose level fasting, after meals and with exercise. Advice that pre and post exercise glucose checks will be done for 3 sessions at entry of program.   Chronic Lung Diseases: - Group verbal and written instruction to review updates, respiratory medications, advancements in procedures and treatments. Discuss use of supplemental oxygen including available portable oxygen systems, continuous and intermittent flow rates, concentrators, personal use and safety guidelines. Review proper use of inhaler and spacers. Provide informative websites for self-education.    Energy Conservation: - Provide group verbal and written instruction for methods to conserve energy, plan and organize activities. Instruct on pacing techniques, use of adaptive equipment and posture/positioning to relieve shortness of breath.   Triggers and Exacerbations: - Group verbal and written instruction to review types of environmental triggers and ways to prevent exacerbations. Discuss weather changes, air quality and the benefits of nasal washing. Review warning signs and symptoms to help prevent infections. Discuss techniques for effective airway clearance, coughing, and vibrations.   AED/CPR: - Group verbal and written instruction with the  use of models to demonstrate the basic use of the AED with the basic ABC's of resuscitation.   Anatomy and Physiology of the Lungs: - Group verbal and written instruction with the use of models to provide basic lung anatomy and physiology related to function, structure and complications of lung disease.   Anatomy & Physiology of the Heart: - Group verbal and written instruction and models provide basic cardiac anatomy and physiology, with the coronary electrical and arterial systems. Review of Valvular disease and Heart Failure   Cardiac Medications: - Group verbal and written instruction to review commonly prescribed medications for heart disease. Reviews the medication, class of the drug, and side effects.   Know Your Numbers and Risk Factors: -Group verbal and written instruction about important numbers in your health.  Discussion of what are risk factors and how they play a role in the disease process.  Review of Cholesterol, Blood Pressure, Diabetes, and BMI and the role they play in your overall health.   Sleep Hygiene: -Provides group verbal and written  instruction about how sleep can affect your health.  Define sleep hygiene, discuss sleep cycles and impact of sleep habits. Review good sleep hygiene tips.    Pulmonary Rehab from 09/18/2018 in [facility] Cardiac and Pulmonary Rehab  Date  09/18/18  Educator  [facility]  Instruction Review Code  1- Verbalizes Understanding      Other: -Provides group and verbal instruction on various topics (see comments)    Knowledge Questionnaire Score: Knowledge Questionnaire Score - 08/26/18 1433      Knowledge Questionnaire Score   Pre Score  15/18   reviewed with patient       Core Components/Risk Factors/Patient Goals at Admission: Personal Goals and Risk Factors at Admission - 08/26/18 1458      Core Components/Risk Factors/Patient Goals on Admission    Weight Management  Yes;Weight Loss    Intervention  Weight Management: Develop a  combined nutrition and exercise program designed to reach desired caloric intake, while maintaining appropriate intake of nutrient and fiber, sodium and fats, and appropriate energy expenditure required for the weight goal.;Weight Management: Provide education and appropriate resources to help participant work on and attain dietary goals.;Weight Management/Obesity: Establish reasonable short term and long term weight goals.    Admit Weight  183 lb 8 oz (83.2 kg)    Goal Weight: Short Term  178 lb (80.7 kg)    Goal Weight: Long Term  170 lb (77.1 kg)    Expected Outcomes  Short Term: Continue to assess and modify interventions until short term weight is achieved;Long Term: Adherence to nutrition and physical activity/exercise program aimed toward attainment of established weight goal;Weight Loss: Understanding of general recommendations for a balanced deficit meal plan, which promotes 1-2 lb weight loss per week and includes a negative energy balance of [phone] kcal/d;Understanding recommendations for meals to include 15-35% energy as protein, 25-35% energy from fat, 35-60% energy from carbohydrates, less than 224m of dietary cholesterol, 20-35 gm of total fiber daily;Understanding of distribution of calorie intake throughout the day with the consumption of 4-5 meals/snacks    Improve shortness of breath with ADL's  Yes    Intervention  Provide education, individualized exercise plan and daily activity instruction to help decrease symptoms of SOB with activities of daily living.    Expected Outcomes  Short Term: Improve cardiorespiratory fitness to achieve a reduction of symptoms when performing ADLs;Long Term: Be able to perform more ADLs without symptoms or delay the onset of symptoms       Core Components/Risk Factors/Patient Goals Review:  Goals and Risk Factor Review    Row Name 09/18/18 1146             Core Components/Risk Factors/Patient Goals Review   Personal Goals Review  Weight  Management/Obesity;Improve shortness of breath with ADL's       Review  Patient would like to lose a little bit of weight to help with his breathing. He is trying to attend class regularly since he has been out most of December.        Expected Outcomes  Short: Attend [facility] regularly to improve shortness of breath with ADL's. Long: maintain independence with ADL's           Core Components/Risk Factors/Patient Goals at Discharge (Final Review):  Goals and Risk Factor Review - 09/18/18 1146      Core Components/Risk Factors/Patient Goals Review   Personal Goals Review  Weight Management/Obesity;Improve shortness of breath with ADL's    Review  Patient would like to lose  a little bit of weight to help with his breathing. He is trying to attend class regularly since he has been out most of December.     Expected Outcomes  Short: Attend [facility] regularly to improve shortness of breath with ADL's. Long: maintain independence with ADL's        ITP Comments: ITP Comments    Row Name 08/26/18 1425           ITP Comments  Medical Evaluation completed. Chart sent for review and changes to Dr. Emily Filbert Director of [facility]. Diagnosis can be found in Care everywhere 08/15/18          Comments: 30 day review

## 2018-09-25 ENCOUNTER — Encounter: Payer: Medicare Other | Admitting: *Deleted

## 2018-09-25 DIAGNOSIS — J84112 Idiopathic pulmonary fibrosis: Secondary | ICD-10-CM

## 2018-09-25 DIAGNOSIS — R0602 Shortness of breath: Secondary | ICD-10-CM | POA: Diagnosis not present

## 2018-09-25 NOTE — Progress Notes (Signed)
Daily Session Note  Patient Details  Name: Caleb Townsend MRN: [id number] Date of Birth: 11/24/1950 Referring Provider:     Pulmonary Rehab from 08/26/2018 in [facility] Cardiac and Pulmonary Rehab  Referring Provider  Swaminathan, Aparna MD      Encounter Date: 09/25/2018  Check In: Session Check In - 09/25/18 1040      Check-In   Supervising physician immediately available to respond to emergencies  [facility] immediately available ER MD    Physician(s)  Drs. Stafford and Williams    Location  [facility]-Cardiac & Pulmonary Rehab    Staff Present  Jessica Hawkins, MA, RCEP, CCRP, Exercise Physiologist;Joseph Hood RCP,RRT,BSRT;Amanda Sommer, BA, ACSM CEP, Exercise Physiologist    Medication changes reported      No    Fall or balance concerns reported     No    Warm-up and Cool-down  Performed as group-led instruction    Resistance Training Performed  Yes    VAD Patient?  No    PAD/SET Patient?  No      Pain Assessment   Currently in Pain?  No/denies          Social History   Tobacco Use  Smoking Status Former Smoker  . Packs/day: 1.00  . Years: 30.00  . Pack years: 30.00  . Types: Cigarettes  . Last attempt to quit: 09/12/2007  . Years since quitting: 11.0  Smokeless Tobacco Former User  . Quit date: 09/11/2012    Goals Met:  Proper associated with RPD/PD & O2 Sat Independence with exercise equipment Using PLB without cueing & demonstrates good technique Exercise tolerated well No report of cardiac concerns or symptoms Strength training completed today  Goals Unmet:  Not Applicable  Comments: Pt able to follow exercise prescription today without complaint.  Will continue to monitor for progression.    Dr. Mark Miller is Medical Director for [facility] and [facility]. 

## 2018-09-27 ENCOUNTER — Encounter: Payer: Medicare Other | Admitting: *Deleted

## 2018-09-27 DIAGNOSIS — J84112 Idiopathic pulmonary fibrosis: Secondary | ICD-10-CM

## 2018-09-27 DIAGNOSIS — R0602 Shortness of breath: Secondary | ICD-10-CM | POA: Diagnosis not present

## 2018-09-27 NOTE — Progress Notes (Signed)
Daily Session Note  Patient Details  Name: Caleb Townsend MRN: [id number] Date of Birth: 03/12/1951 Referring Provider:     Pulmonary Rehab from 08/26/2018 in [facility] Cardiac and Pulmonary Rehab  Referring Provider  Swaminathan, Aparna MD      Encounter Date: 09/27/2018  Check In: Session Check In - 09/27/18 1015      Check-In   Supervising physician immediately available to respond to emergencies  [facility] immediately available ER MD    Physician(s)  Dr. Kinner and Schaevitz    Location  [facility]-Cardiac & Pulmonary Rehab    Staff Present  Joseph Hood RCP,RRT,BSRT;Amanda Sommer, BA, ACSM CEP, Exercise Physiologist;Meredith Craven, RN BSN    Medication changes reported      No    Fall or balance concerns reported     No    Tobacco Cessation  No Change    Warm-up and Cool-down  Performed as group-led instruction    Resistance Training Performed  Yes    VAD Patient?  No    PAD/SET Patient?  No      Pain Assessment   Currently in Pain?  No/denies          Social History   Tobacco Use  Smoking Status Former Smoker  . Packs/day: 1.00  . Years: 30.00  . Pack years: 30.00  . Types: Cigarettes  . Last attempt to quit: 09/12/2007  . Years since quitting: 11.0  Smokeless Tobacco Former User  . Quit date: 09/11/2012    Goals Met:  Proper associated with RPD/PD & O2 Sat Independence with exercise equipment Exercise tolerated well No report of cardiac concerns or symptoms Strength training completed today  Goals Unmet:  Not Applicable  Comments: Reviewed home exercise with pt today.  Pt plans to go to [place] for exercise.  Reviewed THR, pulse, RPE, sign and symptoms, NTG use, and when to call 911 or MD.  Also discussed weather considerations and indoor options.  Pt voiced understanding.    Dr. Mark Miller is Medical Director for [facility] and [facility]. 

## 2018-09-30 ENCOUNTER — Encounter: Payer: Medicare Other | Admitting: *Deleted

## 2018-09-30 DIAGNOSIS — J84112 Idiopathic pulmonary fibrosis: Secondary | ICD-10-CM

## 2018-09-30 DIAGNOSIS — R0602 Shortness of breath: Secondary | ICD-10-CM | POA: Diagnosis not present

## 2018-09-30 NOTE — Progress Notes (Signed)
Daily Session Note  Patient Details  Name: Ceaser Ebeling MRN: [id number] Date of Birth: 1950-09-20 Referring Provider:     Pulmonary Rehab from 08/26/2018 in [facility] Cardiac and Pulmonary Rehab  Referring Provider  Rosana Berger MD      Encounter Date: 09/30/2018  Check In: Session Check In - 09/30/18 1017      Check-In   Supervising physician immediately available to respond to emergencies  [facility] immediately available ER MD    Physician(s)  Dr. Quentin Cornwall and Dr. Jimmye Norman    Location  [facility]-Cardiac & Pulmonary Rehab    Staff Present  Earlean Shawl, BS, ACSM CEP, Exercise Physiologist;Joseph Darrin Nipper, [place], RCEP, CCRP, Exercise Physiologist    Medication changes reported      No    Fall or balance concerns reported     No    Tobacco Cessation  No Change    Warm-up and Cool-down  Performed as group-led instruction    Resistance Training Performed  Yes    VAD Patient?  No    PAD/SET Patient?  No      Pain Assessment   Currently in Pain?  No/denies    Multiple Pain Sites  No          Social History   Tobacco Use  Smoking Status Former Smoker  . Packs/day: 1.00  . Years: 30.00  . Pack years: 30.00  . Types: Cigarettes  . Last attempt to quit: 09/12/2007  . Years since quitting: 11.0  Smokeless Tobacco Former Systems developer  . Quit date: 09/11/2012    Goals Met:  Proper associated with RPD/PD & O2 Sat Independence with exercise equipment Exercise tolerated well No report of cardiac concerns or symptoms Strength training completed today  Goals Unmet:  Not Applicable  Comments: Pt able to follow exercise prescription today without complaint.  Will continue to monitor for progression.    Dr. Emily Filbert is Medical Director for [place] and [facility].

## 2018-10-02 ENCOUNTER — Encounter: Payer: Medicare Other | Admitting: *Deleted

## 2018-10-02 DIAGNOSIS — J84112 Idiopathic pulmonary fibrosis: Secondary | ICD-10-CM

## 2018-10-02 DIAGNOSIS — R0602 Shortness of breath: Secondary | ICD-10-CM | POA: Diagnosis not present

## 2018-10-02 NOTE — Progress Notes (Signed)
Daily Session Note  Patient Details  Name: Caleb Townsend MRN: [id number] Date of Birth: [birth year] Referring Provider:     Pulmonary Rehab from 08/26/2018 in [facility] Cardiac and Pulmonary Rehab  Referring Provider  Rosana Berger MD      Encounter Date: 10/02/2018  Check In: Session Check In - 10/02/18 1051      Check-In   Supervising physician immediately available to respond to emergencies  [facility] immediately available ER MD    Physician(s)  Drs. Kipp Laurence    Location  [facility]-Cardiac & Pulmonary Rehab    Staff Present  Alberteen Sam, MA, RCEP, CCRP, Exercise Physiologist;Joseph Foy [place], [place], ACSM CEP, Exercise Physiologist    Medication changes reported      No    Fall or balance concerns reported     No    Warm-up and Cool-down  Performed as group-led instruction    Resistance Training Performed  Yes    VAD Patient?  No    PAD/SET Patient?  No      Pain Assessment   Currently in Pain?  No/denies          Social History   Tobacco Use  Smoking Status Former Smoker  . Packs/day: 1.00  . Years: 30.00  . Pack years: 30.00  . Types: Cigarettes  . Last attempt to quit: 09/12/2007  . Years since quitting: 11.0  Smokeless Tobacco Former Systems developer  . Quit date: 09/11/2012    Goals Met:  Proper associated with RPD/PD & O2 Sat Independence with exercise equipment Using PLB without cueing & demonstrates good technique Exercise tolerated well No report of cardiac concerns or symptoms Strength training completed today  Goals Unmet:  Not Applicable  Comments: Pt able to follow exercise prescription today without complaint.  Will continue to monitor for progression.    Dr. Emily Filbert is Medical Director for [place] and [facility].

## 2018-10-04 DIAGNOSIS — R0602 Shortness of breath: Secondary | ICD-10-CM | POA: Diagnosis not present

## 2018-10-04 DIAGNOSIS — J84112 Idiopathic pulmonary fibrosis: Secondary | ICD-10-CM

## 2018-10-04 NOTE — Progress Notes (Signed)
Daily Session Note  Patient Details  Name: Caleb Townsend MRN: [id number] Date of Birth: May 18, 1951 Referring Provider:     Pulmonary Rehab from 08/26/2018 in [facility] Cardiac and Pulmonary Rehab  Referring Provider  Rosana Berger MD      Encounter Date: 10/04/2018  Check In: Session Check In - 10/04/18 1012      Check-In   Supervising physician immediately available to respond to emergencies  [facility] immediately available ER MD    Physician(s)  Dr. Mariea Clonts and Corky Downs    Location  [facility]-Cardiac & Pulmonary Rehab    Staff Present  Justin Mend RCP,RRT,BSRT;Meredith Sherryll Burger, RN Vickki Hearing, BA, ACSM CEP, Exercise Physiologist    Medication changes reported      No    Fall or balance concerns reported     No    Warm-up and Cool-down  Performed as group-led instruction    Resistance Training Performed  Yes    VAD Patient?  No    PAD/SET Patient?  No      Pain Assessment   Currently in Pain?  No/denies          Social History   Tobacco Use  Smoking Status Former Smoker  . Packs/day: 1.00  . Years: 30.00  . Pack years: 30.00  . Types: Cigarettes  . Last attempt to quit: 09/12/2007  . Years since quitting: 11.0  Smokeless Tobacco Former Systems developer  . Quit date: 09/11/2012    Goals Met:  Independence with exercise equipment Exercise tolerated well No report of cardiac concerns or symptoms Strength training completed today  Goals Unmet:  Not Applicable  Comments: Pt able to follow exercise prescription today without complaint.  Will continue to monitor for progression.    Dr. Emily Filbert is Medical Director for [place] and [facility].

## 2018-10-09 DIAGNOSIS — J84112 Idiopathic pulmonary fibrosis: Secondary | ICD-10-CM

## 2018-10-09 DIAGNOSIS — R0602 Shortness of breath: Secondary | ICD-10-CM | POA: Diagnosis not present

## 2018-10-09 NOTE — Progress Notes (Signed)
Daily Session Note  Patient Details  Name: Caleb Townsend MRN: [id number] Date of Birth: 1950/11/19 Referring Provider:     Pulmonary Rehab from 08/26/2018 in [facility] Cardiac and Pulmonary Rehab  Referring Provider  Rosana Berger MD      Encounter Date: 10/09/2018  Check In: Session Check In - 10/09/18 1037      Check-In   Supervising physician immediately available to respond to emergencies  [facility] immediately available ER MD    Physician(s)  Jimmye Norman and [facility]    Location  [facility]-Cardiac & Pulmonary Rehab    Staff Present  Alberteen Sam, MA, RCEP, CCRP, Exercise Physiologist;Joseph Foy [place], [place], ACSM CEP, Exercise Physiologist    Medication changes reported      No    Fall or balance concerns reported     No    Warm-up and Cool-down  Performed as group-led instruction    Resistance Training Performed  Yes    VAD Patient?  No    PAD/SET Patient?  No      Pain Assessment   Currently in Pain?  No/denies    Multiple Pain Sites  No          Social History   Tobacco Use  Smoking Status Former Smoker  . Packs/day: 1.00  . Years: 30.00  . Pack years: 30.00  . Types: Cigarettes  . Last attempt to quit: 09/12/2007  . Years since quitting: 11.0  Smokeless Tobacco Former Systems developer  . Quit date: 09/11/2012    Goals Met:  Proper associated with RPD/PD & O2 Sat Independence with exercise equipment Exercise tolerated well Strength training completed today  Goals Unmet:  Not Applicable  Comments: Pt able to follow exercise prescription today without complaint.  Will continue to monitor for progression.    Dr. Emily Filbert is Medical Director for [place] and [facility].

## 2018-10-11 DIAGNOSIS — J84112 Idiopathic pulmonary fibrosis: Secondary | ICD-10-CM

## 2018-10-11 DIAGNOSIS — R0602 Shortness of breath: Secondary | ICD-10-CM | POA: Diagnosis not present

## 2018-10-11 NOTE — Progress Notes (Signed)
Daily Session Note  Patient Details  Name: Caleb Townsend MRN: [id number] Date of Birth: 08/24/1951 Referring Provider:     Pulmonary Rehab from 08/26/2018 in [facility] Cardiac and Pulmonary Rehab  Referring Provider  Swaminathan, Aparna MD      Encounter Date: 10/11/2018  Check In:      Social History   Tobacco Use  Smoking Status Former Smoker  . Packs/day: 1.00  . Years: 30.00  . Pack years: 30.00  . Types: Cigarettes  . Last attempt to quit: 09/12/2007  . Years since quitting: 11.0  Smokeless Tobacco Former User  . Quit date: 09/11/2012    Goals Met:  Proper associated with RPD/PD & O2 Sat Independence with exercise equipment Exercise tolerated well Strength training completed today  Goals Unmet:  Not Applicable  Comments: Pt able to follow exercise prescription today without complaint.  Will continue to monitor for progression.    Dr. Mark Miller is Medical Director for [facility] and [facility]. 

## 2018-10-14 ENCOUNTER — Encounter: Payer: Medicare Other | Attending: Internal Medicine

## 2018-10-14 DIAGNOSIS — J84112 Idiopathic pulmonary fibrosis: Secondary | ICD-10-CM | POA: Insufficient documentation

## 2018-10-14 DIAGNOSIS — R0602 Shortness of breath: Secondary | ICD-10-CM | POA: Insufficient documentation

## 2018-10-16 DIAGNOSIS — R0602 Shortness of breath: Secondary | ICD-10-CM | POA: Diagnosis not present

## 2018-10-16 DIAGNOSIS — J84112 Idiopathic pulmonary fibrosis: Secondary | ICD-10-CM | POA: Diagnosis not present

## 2018-10-16 NOTE — Progress Notes (Signed)
Daily Session Note  Patient Details  Name: Caleb Townsend MRN: [id number] Date of Birth: 03-28-1951 Referring Provider:     Pulmonary Rehab from 08/26/2018 in [facility] Cardiac and Pulmonary Rehab  Referring Provider  Rosana Berger MD      Encounter Date: 10/16/2018  Check In: Session Check In - 10/16/18 1130      Check-In   Supervising physician immediately available to respond to emergencies  [facility] immediately available ER MD    Physician(s)  Jimmye Norman and Alfred Levins    Location  [facility]-Cardiac & Pulmonary Rehab    Staff Present  Justin Mend RCP,RRT,BSRT;Amanda Oletta Darter, BA, ACSM CEP, Exercise Physiologist;Jessica Luan Pulling, MA, RCEP, CCRP, Exercise Physiologist    Medication changes reported      No    Fall or balance concerns reported     No    Warm-up and Cool-down  Performed as group-led instruction    Resistance Training Performed  Yes    VAD Patient?  No    PAD/SET Patient?  No      Pain Assessment   Currently in Pain?  No/denies    Multiple Pain Sites  No          Social History   Tobacco Use  Smoking Status Former Smoker  . Packs/day: 1.00  . Years: 30.00  . Pack years: 30.00  . Types: Cigarettes  . Last attempt to quit: 09/12/2007  . Years since quitting: 11.1  Smokeless Tobacco Former Systems developer  . Quit date: 09/11/2012    Goals Met:  Independence with exercise equipment Exercise tolerated well No report of cardiac concerns or symptoms Strength training completed today  Goals Unmet:  Not Applicable  Comments: Pt able to follow exercise prescription today without complaint.  Will continue to monitor for progression.    Dr. Emily Filbert is Medical Director for [place] and [facility].

## 2018-10-18 ENCOUNTER — Encounter: Payer: Medicare Other | Admitting: *Deleted

## 2018-10-18 DIAGNOSIS — J84112 Idiopathic pulmonary fibrosis: Secondary | ICD-10-CM

## 2018-10-18 DIAGNOSIS — R0602 Shortness of breath: Secondary | ICD-10-CM | POA: Diagnosis not present

## 2018-10-18 NOTE — Progress Notes (Signed)
Daily Session Note  Patient Details  Name: Caleb Townsend MRN: [id number] Date of Birth: 05/06/1951 Referring Provider:     Pulmonary Rehab from 08/26/2018 in [facility] Cardiac and Pulmonary Rehab  Referring Provider  Rosana Berger MD      Encounter Date: 10/18/2018  Check In: Session Check In - 10/18/18 1014      Check-In   Supervising physician immediately available to respond to emergencies  [facility] immediately available ER MD    Physician(s)  Dr. Jimmye Norman and Corky Downs    Location  [facility]-Cardiac & Pulmonary Rehab    Staff Present  Renita Papa, RN BSN;Joseph [facility], [place], ACSM CEP, Exercise Physiologist    Medication changes reported      No    Fall or balance concerns reported     No    Tobacco Cessation  No Change    Warm-up and Cool-down  Performed as group-led instruction    Resistance Training Performed  Yes    VAD Patient?  No    PAD/SET Patient?  No      Pain Assessment   Currently in Pain?  No/denies          Social History   Tobacco Use  Smoking Status Former Smoker  . Packs/day: 1.00  . Years: 30.00  . Pack years: 30.00  . Types: Cigarettes  . Last attempt to quit: 09/12/2007  . Years since quitting: 11.1  Smokeless Tobacco Former Systems developer  . Quit date: 09/11/2012    Goals Met:  Proper associated with RPD/PD & O2 Sat Independence with exercise equipment Exercise tolerated well No report of cardiac concerns or symptoms Strength training completed today  Goals Unmet:  Not Applicable  Comments: Pt able to follow exercise prescription today without complaint.  Will continue to monitor for progression.    Dr. Emily Filbert is Medical Director for [place] and [facility].

## 2018-10-21 ENCOUNTER — Encounter: Payer: Medicare Other | Admitting: *Deleted

## 2018-10-21 DIAGNOSIS — J84112 Idiopathic pulmonary fibrosis: Secondary | ICD-10-CM

## 2018-10-21 DIAGNOSIS — R0602 Shortness of breath: Secondary | ICD-10-CM | POA: Diagnosis not present

## 2018-10-21 NOTE — Progress Notes (Signed)
Daily Session Note  Patient Details  Name: Caleb Townsend MRN: [id number] Date of Birth: 28-Jan-1951 Referring Provider:     Pulmonary Rehab from 08/26/2018 in [facility]  Referring Provider  Rosana Berger MD      Encounter Date: 10/21/2018  Check In: Session Check In - 10/21/18 1017      Check-In   Supervising physician immediately available to respond to emergencies  [facility] immediately available ER MD    Physician(s)  Dr. Corky Downs and Dr. Clearnce Hasten     Location  [facility]-Cardiac & Pulmonary Rehab    Staff Present  Earlean Shawl, BS, ACSM CEP, Exercise Physiologist;Joseph [facility] [place], [place], ACSM CEP, Exercise Physiologist    Medication changes reported      No    Fall or balance concerns reported     No    Tobacco Cessation  No Change    Warm-up and Cool-down  Performed as group-led instruction    Resistance Training Performed  Yes    VAD Patient?  No    PAD/SET Patient?  No      Pain Assessment   Currently in Pain?  No/denies    Multiple Pain Sites  No          Social History   Tobacco Use  Smoking Status Former Smoker  . Packs/day: 1.00  . Years: 30.00  . Pack years: 30.00  . Types: Cigarettes  . Last attempt to quit: 09/12/2007  . Years since quitting: 11.1  Smokeless Tobacco Former Systems developer  . Quit date: 09/11/2012    Goals Met:  Proper associated with RPD/PD & O2 Sat Independence with exercise equipment Exercise tolerated well No report of cardiac concerns or symptoms Strength training completed today  Goals Unmet:  Not Applicable  Comments: Pt able to follow exercise prescription today without complaint.  Will continue to monitor for progression.    Dr. Emily Filbert is Medical Director for [place] and [facility].

## 2018-10-21 NOTE — Progress Notes (Signed)
Pulmonary Individual Treatment Plan  Patient Details  Name: Caleb Townsend MRN: [id number] Date of Birth: June 25, 1951 Referring Provider:     Pulmonary Rehab from 08/26/2018 in [facility] Cardiac and Pulmonary Rehab  Referring Provider  Rosana Berger MD      Initial Encounter Date:    Pulmonary Rehab from 08/26/2018 in [facility] Cardiac and Pulmonary Rehab  Date  08/26/18      Visit Diagnosis: IPF (idiopathic pulmonary fibrosis) ([place])  Patient's Home Medications on Admission: No current outpatient medications on file.  Past Medical History: No past medical history on file.  Tobacco Use: Social History   Tobacco Use  Smoking Status Former Smoker  . Packs/day: 1.00  . Years: 30.00  . Pack years: 30.00  . Types: Cigarettes  . Last attempt to quit: 09/12/2007  . Years since quitting: 11.1  Smokeless Tobacco Former Systems developer  . Quit date: 09/11/2012    Labs: Recent Review Flowsheet Data    There is no flowsheet data to display.       Pulmonary Assessment Scores: Pulmonary Assessment Scores    Row Name 08/26/18 1431         ADL UCSD   ADL Phase  Entry     SOB Score total  51     Rest  0     Walk  2     Stairs  4     Bath  3     Dress  2     Shop  1       CAT Score   CAT Score  24       mMRC Score   mMRC Score  2        Pulmonary Function Assessment: Pulmonary Function Assessment - 08/26/18 1452      Breath   Bilateral Breath Sounds  Clear;Decreased    Shortness of Breath  Yes;Limiting activity       Exercise Target Goals: Exercise Program Goal: Individual exercise prescription set using results from initial 6 min walk test and THRR while considering  patient's activity barriers and safety.   Exercise Prescription Goal: Initial exercise prescription builds to 30-45 minutes a day of aerobic activity, 2-3 days per week.  Home exercise guidelines will be given to patient during program as part of exercise prescription that the participant will  acknowledge.  Activity Barriers & Risk Stratification: Activity Barriers & Cardiac Risk Stratification - 08/26/18 1459      Activity Barriers & Cardiac Risk Stratification   Activity Barriers  Deconditioning;Shortness of Breath;Back Problems   previous back surgery x2      6 Minute Walk: 6 Minute Walk    Row Name 08/26/18 1458         6 Minute Walk   Phase  Initial     Distance  1410 feet     Walk Time  6 minutes     # of Rest Breaks  0     MPH  2.67     METS  4.06     RPE  12     Perceived Dyspnea   2     VO2 Peak  14.22     Symptoms  Yes (comment)     Comments  SOB     Resting HR  107 bpm     Resting BP  126/70     Resting Oxygen Saturation   95 %     Exercise Oxygen Saturation  during 6 min walk  87 %  Max Ex. HR  134 bpm     Max Ex. BP  164/84     2 Minute Post BP  126/72       Interval HR   1 Minute HR  110     2 Minute HR  109     3 Minute HR  109     4 Minute HR  109     5 Minute HR  109     6 Minute HR  134     2 Minute Post HR  115     Interval Heart Rate?  Yes       Interval Oxygen   Interval Oxygen?  Yes     Baseline Oxygen Saturation %  95 %     1 Minute Oxygen Saturation %  92 %     1 Minute Liters of Oxygen  0 L Room Air     2 Minute Oxygen Saturation %  88 %     2 Minute Liters of Oxygen  0 L     3 Minute Oxygen Saturation %  88 %     3 Minute Liters of Oxygen  0 L     4 Minute Oxygen Saturation %  87 %     4 Minute Liters of Oxygen  0 L     5 Minute Oxygen Saturation %  88 %     5 Minute Liters of Oxygen  0 L     6 Minute Oxygen Saturation %  87 %     6 Minute Liters of Oxygen  0 L     2 Minute Post Oxygen Saturation %  95 %     2 Minute Post Liters of Oxygen  0 L       Oxygen Initial Assessment: Oxygen Initial Assessment - 08/26/18 1452      Home Oxygen   Home Oxygen Device  None    Sleep Oxygen Prescription  None    Home Exercise Oxygen Prescription  None    Home at Rest Exercise Oxygen Prescription  None      Initial 6  min Walk   Oxygen Used  None      Program Oxygen Prescription   Program Oxygen Prescription  None      Intervention   Short Term Goals  To learn and demonstrate proper use of respiratory medications;To learn and demonstrate proper pursed lip breathing techniques or other breathing techniques.;To learn and understand importance of maintaining oxygen saturations>88%;To learn and understand importance of monitoring SPO2 with pulse oximeter and demonstrate accurate use of the pulse oximeter.    Long  Term Goals  Maintenance of O2 saturations>88%;Verbalizes importance of monitoring SPO2 with pulse oximeter and return demonstration;Exhibits proper breathing techniques, such as pursed lip breathing or other method taught during program session;Compliance with respiratory medication;Demonstrates proper use of MDI's       Oxygen Re-Evaluation: Oxygen Re-Evaluation    Row Name 09/16/18 1025 09/27/18 1044           Program Oxygen Prescription   Program Oxygen Prescription  None  None        Home Oxygen   Home Oxygen Device  None  Home Concentrator uses PRN when he has exerted himself      Sleep Oxygen Prescription  None  None      Home Exercise Oxygen Prescription  None  None      Home at Rest Exercise Oxygen Prescription  None  None  Compliance with Home Oxygen Use  -  Yes        Goals/Expected Outcomes   Short Term Goals  To learn and demonstrate proper use of respiratory medications;To learn and demonstrate proper pursed lip breathing techniques or other breathing techniques.;To learn and understand importance of maintaining oxygen saturations>88%;To learn and understand importance of monitoring SPO2 with pulse oximeter and demonstrate accurate use of the pulse oximeter.  To learn and demonstrate proper use of respiratory medications;To learn and demonstrate proper pursed lip breathing techniques or other breathing techniques.;To learn and understand importance of maintaining oxygen  saturations>88%;To learn and understand importance of monitoring SPO2 with pulse oximeter and demonstrate accurate use of the pulse oximeter.      Long  Term Goals  Maintenance of O2 saturations>88%;Verbalizes importance of monitoring SPO2 with pulse oximeter and return demonstration;Exhibits proper breathing techniques, such as pursed lip breathing or other method taught during program session;Compliance with respiratory medication;Demonstrates proper use of MDI's  Maintenance of O2 saturations>88%;Verbalizes importance of monitoring SPO2 with pulse oximeter and return demonstration;Exhibits proper breathing techniques, such as pursed lip breathing or other method taught during program session;Compliance with respiratory medication;Demonstrates proper use of MDI's      Comments  Reviewed PLB technique with pt.  Talked about how it work and it's important to maintaining his exercise saturations.    Patient has been using PLB and he states that it has been helping him. His doctor ordered him a portable unit and is in the process of getting one. He has a concentrator at home but does not use it when he is at rest.      Goals/Expected Outcomes  Short: Become more profiecient at using PLB.   Long: Become independent at using PLB.  Short: obtain a portable oxygen concentrator. Long: use portable oxygen concentrator as prescribed.         Oxygen Discharge (Final Oxygen Re-Evaluation): Oxygen Re-Evaluation - 09/27/18 1044      Program Oxygen Prescription   Program Oxygen Prescription  None      Home Oxygen   Home Oxygen Device  Home Concentrator   uses PRN when he has exerted himself   Sleep Oxygen Prescription  None    Home Exercise Oxygen Prescription  None    Home at Rest Exercise Oxygen Prescription  None    Compliance with Home Oxygen Use  Yes      Goals/Expected Outcomes   Short Term Goals  To learn and demonstrate proper use of respiratory medications;To learn and demonstrate proper pursed lip  breathing techniques or other breathing techniques.;To learn and understand importance of maintaining oxygen saturations>88%;To learn and understand importance of monitoring SPO2 with pulse oximeter and demonstrate accurate use of the pulse oximeter.    Long  Term Goals  Maintenance of O2 saturations>88%;Verbalizes importance of monitoring SPO2 with pulse oximeter and return demonstration;Exhibits proper breathing techniques, such as pursed lip breathing or other method taught during program session;Compliance with respiratory medication;Demonstrates proper use of MDI's    Comments  Patient has been using PLB and he states that it has been helping him. His doctor ordered him a portable unit and is in the process of getting one. He has a concentrator at home but does not use it when he is at rest.    Goals/Expected Outcomes  Short: obtain a portable oxygen concentrator. Long: use portable oxygen concentrator as prescribed.       Initial Exercise Prescription: Initial Exercise Prescription - 08/26/18 1500  Date of Initial Exercise RX and Referring Provider   Date  08/26/18    Referring Provider  Rosana Berger MD      Treadmill   MPH  2.5    Grade  1    Minutes  15    METs  3.26      Recumbant Elliptical   Level  2    RPM  50    Minutes  15    METs  3      T5 Nustep   Level  3    SPM  80    Minutes  15    METs  3.2      Prescription Details   Frequency (times per week)  3    Duration  Progress to 45 minutes of aerobic exercise without signs/symptoms of physical distress      Intensity   THRR 40-80% of Max Heartrate  128/143    Ratings of Perceived Exertion  11-13    Perceived Dyspnea  0-4      Progression   Progression  Continue to progress workloads to maintain intensity without signs/symptoms of physical distress.      Resistance Training   Training Prescription  Yes    Weight  4 lbs    Reps  10-15       Perform Capillary Blood Glucose checks as  needed.  Exercise Prescription Changes: Exercise Prescription Changes    Row Name 08/26/18 1500 09/25/18 1500 10/10/18 0900         Response to Exercise   Blood Pressure (Admit)  126/70  122/78  118/66     Blood Pressure (Exercise)  164/84  140/64  -     Blood Pressure (Exit)  126/72  122/60  110/68     Heart Rate (Admit)  107 bpm  66 bpm  102 bpm     Heart Rate (Exercise)  134 bpm  128 bpm  123 bpm     Heart Rate (Exit)  115 bpm  111 bpm  111 bpm     Oxygen Saturation (Admit)  95 %  97 %  93 %     Oxygen Saturation (Exercise)  87 %  87 %  86 %     Oxygen Saturation (Exit)  95 %  92 %  91 %     Rating of Perceived Exertion (Exercise)  _0 Perceived Dyspnea (Exercise)  _1 Symptoms  SOB  none  none     Comments  walk test results  4 th session  -     Duration  -  Continue with 45 min of aerobic exercise without signs/symptoms of physical distress.  Continue with 45 min of aerobic exercise without signs/symptoms of physical distress.     Intensity  -  THRR unchanged  THRR unchanged       Progression   Progression  -  Continue to progress workloads to maintain intensity without signs/symptoms of physical distress.  Continue to progress workloads to maintain intensity without signs/symptoms of physical distress.     Average METs  -  2.5  2.6       Resistance Training   Training Prescription  -  Yes  Yes     Weight  -  4 lb  4 lb     Reps  -  10-15  10-15       Interval Training  Interval Training  -  No  No       Treadmill   MPH  -  2.5  2.5     Grade  -  1  1     Minutes  -  15  15     METs  -  3.26  3.26       Recumbant Elliptical   Level  -  3  3     RPM  -  50  50     Minutes  -  15  15     METs  -  2.2  2.3       T5 Nustep   Level  -  3  3     SPM  -  80  80     Minutes  -  15  15     METs  -  2  2        Exercise Comments: Exercise Comments    Row Name 09/16/18 1024           Exercise Comments   First full day of exercise!  Patient  was oriented to gym and equipment including functions, settings, policies, and procedures.  Patient's individual exercise prescription and treatment plan were reviewed.  All starting workloads were established based on the results of the 6 minute walk test done at initial orientation visit.  The plan for exercise progression was also introduced and progression will be customized based on patient's performance and goals.          Exercise Goals and Review: Exercise Goals    Row Name 08/26/18 1502             Exercise Goals   Increase Physical Activity  Yes       Intervention  Provide advice, education, support and counseling about physical activity/exercise needs.;Develop an individualized exercise prescription for aerobic and resistive training based on initial evaluation findings, risk stratification, comorbidities and participant's personal goals.       Expected Outcomes  Short Term: Attend rehab on a regular basis to increase amount of physical activity.;Long Term: Add in home exercise to make exercise part of routine and to increase amount of physical activity.;Long Term: Exercising regularly at least 3-5 days a week.       Increase Strength and Stamina  Yes       Intervention  Provide advice, education, support and counseling about physical activity/exercise needs.;Develop an individualized exercise prescription for aerobic and resistive training based on initial evaluation findings, risk stratification, comorbidities and participant's personal goals.       Expected Outcomes  Short Term: Increase workloads from initial exercise prescription for resistance, speed, and METs.;Short Term: Perform resistance training exercises routinely during rehab and add in resistance training at home;Long Term: Improve cardiorespiratory fitness, muscular endurance and strength as measured by increased METs and functional capacity (6MWT)       Able to understand and use rate of perceived exertion (RPE) scale   Yes       Intervention  Provide education and explanation on how to use RPE scale       Expected Outcomes  Short Term: Able to use RPE daily in rehab to express subjective intensity level;Long Term:  Able to use RPE to guide intensity level when exercising independently       Knowledge and understanding of Target Heart Rate Range (THRR)  Yes       Intervention  Provide education and explanation  of THRR including how the numbers were predicted and where they are located for reference       Expected Outcomes  Short Term: Able to state/look up THRR;Long Term: Able to use THRR to govern intensity when exercising independently;Short Term: Able to use daily as guideline for intensity in rehab       Able to check pulse independently  Yes       Intervention  Provide education and demonstration on how to check pulse in carotid and radial arteries.;Review the importance of being able to check your own pulse for safety during independent exercise       Expected Outcomes  Short Term: Able to explain why pulse checking is important during independent exercise;Long Term: Able to check pulse independently and accurately       Understanding of Exercise Prescription  Yes       Intervention  Provide education, explanation, and written materials on patient's individual exercise prescription       Expected Outcomes  Short Term: Able to explain program exercise prescription;Long Term: Able to explain home exercise prescription to exercise independently          Exercise Goals Re-Evaluation : Exercise Goals Re-Evaluation    Row Name 09/16/18 1024 09/25/18 1542 09/27/18 1032 10/10/18 0947       Exercise Goal Re-Evaluation   Exercise Goals Review  Increase Physical Activity;Increase Strength and Stamina;Able to understand and use rate of perceived exertion (RPE) scale;Knowledge and understanding of Target Heart Rate Range (THRR);Understanding of Exercise Prescription;Able to understand and use Dyspnea scale  Increase  Physical Activity;Increase Strength and Stamina;Able to understand and use rate of perceived exertion (RPE) scale;Able to understand and use Dyspnea scale;Knowledge and understanding of Target Heart Rate Range (THRR);Understanding of Exercise Prescription  Increase Physical Activity;Able to understand and use rate of perceived exertion (RPE) scale;Knowledge and understanding of Target Heart Rate Range (THRR);Understanding of Exercise Prescription;Increase Strength and Stamina;Able to understand and use Dyspnea scale;Able to check pulse independently  Increase Physical Activity;Increase Strength and Stamina;Able to understand and use rate of perceived exertion (RPE) scale;Able to understand and use Dyspnea scale;Understanding of Exercise Prescription    Comments  Reviewed RPE scale, THR and program prescription with pt today.  Pt voiced understanding and was given a copy of goals to take home.   Caleb Townsend has tolerated exercise well.  He needs supplemental oxygen on the TM only.  Staff will monitor progress.   Reviewed home exercise with pt today.  Pt plans to go to [facility] for exercise.  Reviewed THR, pulse, RPE, sign and symptoms, NTG use, and when to call 911 or MD.  Also discussed weather considerations and indoor options.  Pt voiced understanding.  Caleb Townsend continues to tolerate exercise well.  He has increased 02 on TM to 3 L to maintain 02 above 88%.      Expected Outcomes  Short: Use RPE daily to regulate intensity. Long: Follow program prescription in THR.  Short - attend classs consistently Long - increase MET level  Short - check oxygen when exercising outside program classes one day per week Long - maintain exercise independently  Short - increase levels on seated machines Long - increase overall MET level       Discharge Exercise Prescription (Final Exercise Prescription Changes): Exercise Prescription Changes - 10/10/18 0900      Response to Exercise   Blood Pressure (Admit)  118/66    Blood  Pressure (Exit)  110/68    Heart Rate (Admit)  102 bpm    Heart Rate (Exercise)  123 bpm    Heart Rate (Exit)  111 bpm    Oxygen Saturation (Admit)  93 %    Oxygen Saturation (Exercise)  86 %    Oxygen Saturation (Exit)  91 %    Rating of Perceived Exertion (Exercise)  13    Perceived Dyspnea (Exercise)  3    Symptoms  none    Duration  Continue with 45 min of aerobic exercise without signs/symptoms of physical distress.    Intensity  THRR unchanged      Progression   Progression  Continue to progress workloads to maintain intensity without signs/symptoms of physical distress.    Average METs  2.6      Resistance Training   Training Prescription  Yes    Weight  4 lb    Reps  10-15      Interval Training   Interval Training  No      Treadmill   MPH  2.5    Grade  1    Minutes  15    METs  3.26      Recumbant Elliptical   Level  3    RPM  50    Minutes  15    METs  2.3      T5 Nustep   Level  3    SPM  80    Minutes  15    METs  2       Nutrition:  Target Goals: Understanding of nutrition guidelines, daily intake of sodium <1524m, cholesterol <2043m calories 30% from fat and 7% or less from saturated fats, daily to have 5 or more servings of fruits and vegetables.  Biometrics: Pre Biometrics - 08/26/18 1502      Pre Biometrics   Height  5' 11.25" (1.81 m)    Weight  183 lb 8 oz (83.2 kg)    Waist Circumference  34 inches    Hip Circumference  39 inches    Waist to Hip Ratio  0.87 %    BMI (Calculated)  25.41        Nutrition Therapy Plan and Nutrition Goals: Nutrition Therapy & Goals - 09/18/18 1357      Nutrition Therapy   Diet  TLC    Protein (specify units)  8oz    Fiber  35 grams    Whole Grain Foods  3 servings   does not typically choose whole grains   Saturated Fats  14 max. grams    Fruits and Vegetables  6 servings/day   8 ideal, tries to eat fruits/ vegetables daily   Sodium  1500 grams      Personal Nutrition Goals   Nutrition  Goal  Continue to work on decreasing the amount of sugar added to beverages like sweet tea, great job for getting started with this!    Personal Goal #2  Select lean cuts / options for red meats since you eat them more often than most other protein sources    Comments  States he tries to eat a diet that is lower in fat and cholesterol. He has been using honey to sweeten things like coffee rather than table sugar and has been eating breakfast consistently d/t taking a new medication. Breakfast: cereal with 2% milk, yogurt, sometimes eggs / peanut butter. Lunch: (out 2-3x/wk) burger, Subway sub, tu[place]wich on white bread, leftovers. Dinner: (out 1-2x/wk to [employer] [employer] butt, squash, corn, steak, baked potato, salad,  fish (baked or fried occasionally), pork, shrimp, some chicken but does not prefer it, spaghetti with ground beef. Snacks: fruit, ice cream occasionally. Beverages: water, sweet tea, used to drink beer when golfing but has been unable to golf as of late. His wife does not cook with salt but he does add salt at the table      Intervention Plan   Intervention  Prescribe, educate and counsel regarding individualized specific dietary modifications aiming towards targeted core components such as weight, hypertension, lipid management, diabetes, heart failure and other comorbidities.    Expected Outcomes  Short Term Goal: Understand basic principles of dietary content, such as calories, fat, sodium, cholesterol and nutrients.;Short Term Goal: A plan has been developed with personal nutrition goals set during dietitian appointment.;Long Term Goal: Adherence to prescribed nutrition plan.       Nutrition Assessments: Nutrition Assessments - 08/26/18 1434      MEDFICTS Scores   Pre Score  43       Nutrition Goals Re-Evaluation: Nutrition Goals Re-Evaluation    Row Name 09/18/18 1144 09/18/18 1427 10/16/18 1407         Goals   Nutrition Goal  Lose a little weight  Select lean cuts/  options for red meats since you eat them more often than most other protein sources  Get blood sugar levels lower and improve LDL readings.     Comment  Patient would like to meet with the dietician. He wants to lose a little bit of weight. His diet is important since he has breathing issues and may be wise to find out if he is getting enough nutrition  He prefers red meats over chicken/ turkey/ seafood and eats them multiple times per week  Patient states that his LDL slightly elevated and so was his blood sugar. He is trying to drink more water. With his LDL being a little high he is eating healthier fats like avocado.     Expected Outcome  Short: meet with dietician. Long: adhere to a diet plan.  He will choose lean meats overall, especially for red meats   Short: work on eating lower cholesterol foods. Long: improve LDL value.       Personal Goal #2 Re-Evaluation   Personal Goal #2  -  Continue to work on decreasing the amount of sugar added to beverages like sweet tea, great job for getting started with this!  -        Nutrition Goals Discharge (Final Nutrition Goals Re-Evaluation): Nutrition Goals Re-Evaluation - 10/16/18 1407      Goals   Nutrition Goal  Get blood sugar levels lower and improve LDL readings.    Comment  Patient states that his LDL slightly elevated and so was his blood sugar. He is trying to drink more water. With his LDL being a little high he is eating healthier fats like avocado.    Expected Outcome  Short: work on eating lower cholesterol foods. Long: improve LDL value.       Psychosocial: Target Goals: Acknowledge presence or absence of significant depression and/or stress, maximize coping skills, provide positive support system. Participant is able to verbalize types and ability to use techniques and skills needed for reducing stress and depression.   Initial Review & Psychosocial Screening: Initial Psych Review & Screening - 08/26/18 1453      Initial Review    Current issues with  None Identified      Family Dynamics   Good Support System?  Yes  Comments  He can look to his wife,2 daughters for support and close friends.      Barriers   Psychosocial barriers to participate in program  There are no identifiable barriers or psychosocial needs.;The patient should benefit from training in stress management and relaxation.      Screening Interventions   Interventions  Encouraged to exercise;Program counselor consult;To provide support and resources with identified psychosocial needs;Provide feedback about the scores to participant    Expected Outcomes  Short Term goal: Utilizing psychosocial counselor, staff and physician to assist with identification of specific Stressors or current issues interfering with healing process. Setting desired goal for each stressor or current issue identified.;Long Term Goal: Stressors or current issues are controlled or eliminated.;Short Term goal: Identification and review with participant of any Quality of Life or Depression concerns found by scoring the questionnaire.;Long Term goal: The participant improves quality of Life and PHQ9 Scores as seen by post scores and/or verbalization of changes       Quality of Life Scores:  Scores of 19 and below usually indicate a poorer quality of life in these areas.  A difference of  2-3 points is a clinically meaningful difference.  A difference of 2-3 points in the total score of the Quality of Life Index has been associated with significant improvement in overall quality of life, self-image, physical symptoms, and general health in studies assessing change in quality of life.  PHQ-9: Recent Review Flowsheet Data    Depression screen [facility] 2/9 08/26/2018   Decreased Interest 1   Down, Depressed, Hopeless 0   PHQ - 2 Score 1   Altered sleeping 1   Tired, decreased energy 2   Change in appetite 0   Feeling bad or failure about yourself  0   Trouble concentrating 0   Moving  slowly or fidgety/restless 0   Suicidal thoughts 0   PHQ-9 Score 4   Difficult doing work/chores Somewhat difficult     Interpretation of Total Score  Total Score Depression Severity:  1-4 = Minimal depression, 5-9 = Mild depression, 10-14 = Moderate depression, 15-19 = Moderately severe depression, 20-27 = Severe depression   Psychosocial Evaluation and Intervention: Psychosocial Evaluation - 09/16/18 1037      Psychosocial Evaluation & Interventions   Interventions  Stress management education;Encouraged to exercise with the program and follow exercise prescription    Comments  Counselor met with Mr. Retana [facility]) today for initial psychsocial evaluation.  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  Caleb Townsend has a strong support system with a spouse of 28 years; (2) adult daughters in neighboring communities; good friends and active involvement in his local church.  He has a history of prostate cancer in 2013 and has remained cancer free since that time.  Bobby sleeps well and has a good appetite.  He denies a history of depression or anxiety and is typically in a positive mood.  His health is his primary stressor at this time.  He has goals to improve his breathing so that he can possibly be a transplant recipient.  Caleb Townsend meets with a transplant team on 2/3 & 2/4 to be evaluated for this.  He will be followed by staff here.    Expected Outcomes  Short:  Caleb Townsend will exercise according to his plan to improve his breathing and increase his stamina in order to be a possible transplant recipient.  Long:  Caleb Townsend will continue to make positive self-care choices and maintain  a positive attitude as he moves forward towards the transplant evaluation.      Continue Psychosocial Services   Follow up required by staff       Psychosocial Re-Evaluation: Psychosocial Re-Evaluation    [place] Name 10/02/18 1119             Psychosocial Re-Evaluation   Current issues with  Current Stress Concerns        Comments  Counselor followed with Caleb Townsend today reporting his breathing has "gotten worse" with lower oxygen levels.  He has not noticed any progress in his strength either but was proud to have lost a few pounds and eating healthier - cutting out snacks.  Caleb Townsend continues to have a positive attitude and relies on his family and faith for stress in his life.  He will be meeting with the lung transplant team early February and is hoping for new lungs soon!  Counselor commended [place] on his progress made and his commitment to this process and his health.  Staff will continue to follow.       Expected Outcomes  Short:  Caleb Townsend will continue to exercise consistently for his stamina and strength.  Long:  Caleb Townsend will hopefully get "new lungs" in the next month and be able to return to exercising for follow up/rehabilitation.        Continue Psychosocial Services   Follow up required by staff          Psychosocial Discharge (Final Psychosocial Re-Evaluation): Psychosocial Re-Evaluation - 10/02/18 1119      Psychosocial Re-Evaluation   Current issues with  Current Stress Concerns    Comments  Counselor followed with Caleb Townsend today reporting his breathing has "gotten worse" with lower oxygen levels.  He has not noticed any progress in his strength either but was proud to have lost a few pounds and eating healthier - cutting out snacks.  Caleb Townsend continues to have a positive attitude and relies on his family and faith for stress in his life.  He will be meeting with the lung transplant team early February and is hoping for new lungs soon!  Counselor commended [place] on his progress made and his commitment to this process and his health.  Staff will continue to follow.    Expected Outcomes  Short:  Caleb Townsend will continue to exercise consistently for his stamina and strength.  Long:  Caleb Townsend will hopefully get "new lungs" in the next month and be able to return to exercising for follow up/rehabilitation.     Continue  Psychosocial Services   Follow up required by staff       Education: Education Goals: Education classes will be provided on a weekly basis, covering required topics. Participant will state understanding/return demonstration of topics presented.  Learning Barriers/Preferences: Learning Barriers/Preferences - 08/26/18 1456      Learning Barriers/Preferences   Learning Barriers  None    Learning Preferences  None       Education Topics:  Initial Evaluation Education: - Verbal, written and demonstration of respiratory meds, oximetry and breathing techniques. Instruction on use of nebulizers and MDIs and importance of monitoring MDI activations.   Pulmonary Rehab from 10/18/2018 in [facility] Cardiac and Pulmonary Rehab  Date  08/26/18  Educator  [facility]  Instruction Review Code  1- Verbalizes Understanding      General Nutrition Guidelines/Fats and Fiber: -Group instruction provided by verbal, written material, models and posters to present the general guidelines for heart healthy nutrition. Gives an explanation and review of dietary  fats and fiber.   Controlling Sodium/Reading Food Labels: -Group verbal and written material supporting the discussion of sodium use in heart healthy nutrition. Review and explanation with models, verbal and written materials for utilization of the food label.   Exercise Physiology & General Exercise Guidelines: - Group verbal and written instruction with models to review the exercise physiology of the cardiovascular system and associated critical values. Provides general exercise guidelines with specific guidelines to those with heart or lung disease.    Pulmonary Rehab from 10/18/2018 in [facility] Cardiac and Pulmonary Rehab  Date  10/02/18  Educator  [facility]  Instruction Review Code  1- Verbalizes Understanding      Aerobic Exercise & Resistance Training: - Gives group verbal and written instruction on the various components of exercise. Focuses on aerobic and  resistive training programs and the benefits of this training and how to safely progress through these programs.   Pulmonary Rehab from 10/18/2018 in [facility] Cardiac and Pulmonary Rehab  Date  10/04/18  Educator  [facility]  Instruction Review Code  1- Verbalizes Understanding      Flexibility, Balance, Mind/Body Relaxation: Provides group verbal/written instruction on the benefits of flexibility and balance training, including mind/body exercise modes such as yoga, pilates and tai chi.  Demonstration and skill practice provided.   Pulmonary Rehab from 10/18/2018 in [facility] Cardiac and Pulmonary Rehab  Date  10/09/18  Educator  [facility]  Instruction Review Code  1- Verbalizes Understanding      Stress and Anxiety: - Provides group verbal and written instruction about the health risks of elevated stress and causes of high stress.  Discuss the correlation between heart/lung disease and anxiety and treatment options. Review healthy ways to manage with stress and anxiety.   Pulmonary Rehab from 10/18/2018 in [facility] Cardiac and Pulmonary Rehab  Date  10/16/18  Educator  [facility]  Instruction Review Code  1- Verbalizes Understanding      Depression: - Provides group verbal and written instruction on the correlation between heart/lung disease and depressed mood, treatment options, and the stigmas associated with seeking treatment.   Exercise & Equipment Safety: - Individual verbal instruction and demonstration of equipment use and safety with use of the equipment.   Pulmonary Rehab from 10/18/2018 in [facility] Cardiac and Pulmonary Rehab  Date  08/26/18  Educator  [facility]  Instruction Review Code  1- Verbalizes Understanding      Infection Prevention: - Provides verbal and written material to individual with discussion of infection control including proper hand washing and proper equipment cleaning during exercise session.   Pulmonary Rehab from 10/18/2018 in [facility] Cardiac and Pulmonary Rehab  Date  08/26/18  Educator  [facility]   Instruction Review Code  1- Verbalizes Understanding      Falls Prevention: - Provides verbal and written material to individual with discussion of falls prevention and safety.   Pulmonary Rehab from 10/18/2018 in [facility] Cardiac and Pulmonary Rehab  Date  08/26/18  Educator  [facility]  Instruction Review Code  1- Verbalizes Understanding      Diabetes: - Individual verbal and written instruction to review signs/symptoms of diabetes, desired ranges of glucose level fasting, after meals and with exercise. Advice that pre and post exercise glucose checks will be done for 3 sessions at entry of program.   Chronic Lung Diseases: - Group verbal and written instruction to review updates, respiratory medications, advancements in procedures and treatments. Discuss use of supplemental oxygen including available portable oxygen systems, continuous and intermittent flow rates, concentrators,  personal use and safety guidelines. Review proper use of inhaler and spacers. Provide informative websites for self-education.    Pulmonary Rehab from 10/18/2018 in [facility] Cardiac and Pulmonary Rehab  Date  10/18/18  Educator  [facility]  Instruction Review Code  1- Verbalizes Understanding      Energy Conservation: - Provide group verbal and written instruction for methods to conserve energy, plan and organize activities. Instruct on pacing techniques, use of adaptive equipment and posture/positioning to relieve shortness of breath.   Triggers and Exacerbations: - Group verbal and written instruction to review types of environmental triggers and ways to prevent exacerbations. Discuss weather changes, air quality and the benefits of nasal washing. Review warning signs and symptoms to help prevent infections. Discuss techniques for effective airway clearance, coughing, and vibrations.   AED/CPR: - Group verbal and written instruction with the use of models to demonstrate the basic use of the AED with the basic ABC's of  resuscitation.   Pulmonary Rehab from 10/18/2018 in [facility] Cardiac and Pulmonary Rehab  Date  09/25/18  Educator  [facility]  Instruction Review Code  5- Refused Teaching      Anatomy and Physiology of the Lungs: - Group verbal and written instruction with the use of models to provide basic lung anatomy and physiology related to function, structure and complications of lung disease.   Anatomy & Physiology of the Heart: - Group verbal and written instruction and models provide basic cardiac anatomy and physiology, with the coronary electrical and arterial systems. Review of Valvular disease and Heart Failure   Cardiac Medications: - Group verbal and written instruction to review commonly prescribed medications for heart disease. Reviews the medication, class of the drug, and side effects.   Know Your Numbers and Risk Factors: -Group verbal and written instruction about important numbers in your health.  Discussion of what are risk factors and how they play a role in the disease process.  Review of Cholesterol, Blood Pressure, Diabetes, and BMI and the role they play in your overall health.   Sleep Hygiene: -Provides group verbal and written instruction about how sleep can affect your health.  Define sleep hygiene, discuss sleep cycles and impact of sleep habits. Review good sleep hygiene tips.    Pulmonary Rehab from 10/18/2018 in [facility] Cardiac and Pulmonary Rehab  Date  09/18/18  Educator  [facility]  Instruction Review Code  1- Verbalizes Understanding      Other: -Provides group and verbal instruction on various topics (see comments)    Knowledge Questionnaire Score: Knowledge Questionnaire Score - 08/26/18 1433      Knowledge Questionnaire Score   Pre Score  15/18   reviewed with patient       Core Components/Risk Factors/Patient Goals at Admission: Personal Goals and Risk Factors at Admission - 08/26/18 1458      Core Components/Risk Factors/Patient Goals on Admission    Weight  Management  Yes;Weight Loss    Intervention  Weight Management: Develop a combined nutrition and exercise program designed to reach desired caloric intake, while maintaining appropriate intake of nutrient and fiber, sodium and fats, and appropriate energy expenditure required for the weight goal.;Weight Management: Provide education and appropriate resources to help participant work on and attain dietary goals.;Weight Management/Obesity: Establish reasonable short term and long term weight goals.    Admit Weight  183 lb 8 oz (83.2 kg)    Goal Weight: Short Term  178 lb (80.7 kg)    Goal Weight: Long Term  170 lb (77.1  kg)    Expected Outcomes  Short Term: Continue to assess and modify interventions until short term weight is achieved;Long Term: Adherence to nutrition and physical activity/exercise program aimed toward attainment of established weight goal;Weight Loss: Understanding of general recommendations for a balanced deficit meal plan, which promotes 1-2 lb weight loss per week and includes a negative energy balance of [phone] kcal/d;Understanding recommendations for meals to include 15-35% energy as protein, 25-35% energy from fat, 35-60% energy from carbohydrates, less than 271m of dietary cholesterol, 20-35 gm of total fiber daily;Understanding of distribution of calorie intake throughout the day with the consumption of 4-5 meals/snacks    Improve shortness of breath with ADL's  Yes    Intervention  Provide education, individualized exercise plan and daily activity instruction to help decrease symptoms of SOB with activities of daily living.    Expected Outcomes  Short Term: Improve cardiorespiratory fitness to achieve a reduction of symptoms when performing ADLs;Long Term: Be able to perform more ADLs without symptoms or delay the onset of symptoms       Core Components/Risk Factors/Patient Goals Review:  Goals and Risk Factor Review    Row Name 09/18/18 1146 10/16/18 1405            Core Components/Risk Factors/Patient Goals Review   Personal Goals Review  Weight Management/Obesity;Improve shortness of breath with ADL's  Weight Management/Obesity;Improve shortness of breath with ADL's;Heart Failure      Review  Patient would like to lose a little bit of weight to help with his breathing. He is trying to attend class regularly since he has been out most of December.   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 He is wanting to get a lung transplant and his doctors state that he will have to do rehab that their facility. Informed patient to get as much out of the program as he can before then.      Expected Outcomes  Short: Attend [facility] regularly to improve shortness of breath with ADL's. Long: maintain independence with ADL's   Short: attend [facility] regularly. Long: Do the Lung Transplant program.         Core Components/Risk Factors/Patient Goals at Discharge (Final Review):  Goals and Risk Factor Review - 10/16/18 1405      Core Components/Risk Factors/Patient Goals Review   Personal Goals Review  Weight Management/Obesity;Improve shortness of breath with ADL's;Heart Failure    Review  Patient states that his disease is progressing accourding to his doctor. He is wanting to get a lung transplant and his doctors state that he will have to do rehab that their facility. Informed patient to get as much out of the program as he can before then.    Expected Outcomes  Short: attend [facility] regularly. Long: Do the Lung Transplant program.       ITP Comments: ITP Comments    Row Name 08/26/18 1425 10/21/18 0829         ITP Comments  Medical Evaluation completed. Chart sent for review and changes to Dr. MEmily FilbertDirector of [place] Diagnosis can be found in Care everywhere 08/15/18  30 day review completed. ITP sent to Dr. MEmily FilbertDirector of [place] Continue with ITP unless changes are made by physician.         Comments: 30  day review

## 2018-10-23 ENCOUNTER — Encounter: Payer: Medicare Other | Admitting: *Deleted

## 2018-10-23 DIAGNOSIS — J84112 Idiopathic pulmonary fibrosis: Secondary | ICD-10-CM

## 2018-10-23 DIAGNOSIS — R0602 Shortness of breath: Secondary | ICD-10-CM | POA: Diagnosis not present

## 2018-10-23 NOTE — Progress Notes (Signed)
Daily Session Note  Patient Details  Name: Caleb Townsend MRN: [id number] Date of Birth: Jun 18, 1951 Referring Provider:     Pulmonary Rehab from 08/26/2018 in [facility] Cardiac and Pulmonary Rehab  Referring Provider  Rosana Berger MD      Encounter Date: 10/23/2018  Check In: Session Check In - 10/23/18 1015      Check-In   Supervising physician immediately available to respond to emergencies  [facility] immediately available ER MD    Physician(s)  Dr. Kerman Passey and Mariea Clonts    Location  [facility]-Cardiac & Pulmonary Rehab    Staff Present  Renita Papa, RN BSN;Jessica Luan Pulling, MA, RCEP, CCRP, Exercise Physiologist;Joseph Tessie Fass RCP,RRT,BSRT    Medication changes reported      No    Fall or balance concerns reported     No    Tobacco Cessation  No Change    Warm-up and Cool-down  Performed as group-led instruction    Resistance Training Performed  Yes    VAD Patient?  No    PAD/SET Patient?  No      Pain Assessment   Currently in Pain?  No/denies          Social History   Tobacco Use  Smoking Status Former Smoker  . Packs/day: 1.00  . Years: 30.00  . Pack years: 30.00  . Types: Cigarettes  . Last attempt to quit: 09/12/2007  . Years since quitting: 11.1  Smokeless Tobacco Former Systems developer  . Quit date: 09/11/2012    Goals Met:  Proper associated with RPD/PD & O2 Sat Independence with exercise equipment Exercise tolerated well No report of cardiac concerns or symptoms Strength training completed today  Goals Unmet:  Not Applicable  Comments: Pt able to follow exercise prescription today without complaint.  Will continue to monitor for progression.    Dr. Emily Filbert is Medical Director for [place] and [facility].

## 2018-10-25 ENCOUNTER — Encounter: Payer: Medicare Other | Admitting: *Deleted

## 2018-10-25 DIAGNOSIS — J84112 Idiopathic pulmonary fibrosis: Secondary | ICD-10-CM

## 2018-10-25 DIAGNOSIS — R0602 Shortness of breath: Secondary | ICD-10-CM | POA: Diagnosis not present

## 2018-10-25 NOTE — Progress Notes (Signed)
Daily Session Note  Patient Details  Name: Caleb Townsend MRN: [id number] Date of Birth: 22-Mar-1951 Referring Provider:     Pulmonary Rehab from 08/26/2018 in [facility] Cardiac and Pulmonary Rehab  Referring Provider  Rosana Berger MD      Encounter Date: 10/25/2018  Check In: Session Check In - 10/25/18 1019      Check-In   Supervising physician immediately available to respond to emergencies  [facility] immediately available ER MD    Physician(s)  Dr. Cherylann Banas and Clearnce Hasten    Location  [facility]-Cardiac & Pulmonary Rehab    Staff Present  Renita Papa, RN BSN;Jeanna Durrell BS, Exercise Physiologist;Joseph Tessie Fass RCP,RRT,BSRT    Medication changes reported      No    Fall or balance concerns reported     No    Tobacco Cessation  No Change    Warm-up and Cool-down  Performed as group-led instruction    Resistance Training Performed  Yes    VAD Patient?  No    PAD/SET Patient?  No      Pain Assessment   Currently in Pain?  No/denies          Social History   Tobacco Use  Smoking Status Former Smoker  . Packs/day: 1.00  . Years: 30.00  . Pack years: 30.00  . Types: Cigarettes  . Last attempt to quit: 09/12/2007  . Years since quitting: 11.1  Smokeless Tobacco Former Systems developer  . Quit date: 09/11/2012    Goals Met:  Proper associated with RPD/PD & O2 Sat Independence with exercise equipment Using PLB without cueing & demonstrates good technique Exercise tolerated well No report of cardiac concerns or symptoms Strength training completed today  Goals Unmet:  Not Applicable  Comments: Pt able to follow exercise prescription today without complaint.  Will continue to monitor for progression.    Dr. Emily Filbert is Medical Director for [place] [facility] and [facility].

## 2018-10-28 ENCOUNTER — Encounter: Payer: Medicare Other | Admitting: *Deleted

## 2018-10-28 DIAGNOSIS — R0602 Shortness of breath: Secondary | ICD-10-CM | POA: Diagnosis not present

## 2018-10-28 DIAGNOSIS — J84112 Idiopathic pulmonary fibrosis: Secondary | ICD-10-CM

## 2018-10-28 NOTE — Progress Notes (Signed)
Daily Session Note  Patient Details  Name: Caleb Townsend MRN: [id number] Date of Birth: 1951-01-25 Referring Provider:     Pulmonary Rehab from 08/26/2018 in [facility] Cardiac and Pulmonary Rehab  Referring Provider  Rosana Berger MD      Encounter Date: 10/28/2018  Check In: Session Check In - 10/28/18 1029      Check-In   Supervising physician immediately available to respond to emergencies  [facility] immediately available ER MD    Physician(s)  Dr. Cinda Quest and Dr. Alfred Levins    Location  [facility]-Cardiac & Pulmonary Rehab    Staff Present  Earlean Shawl, BS, ACSM CEP, Exercise Physiologist;Amanda Oletta Darter, BA, ACSM CEP, Exercise Physiologist;Joseph Tessie Fass RCP,RRT,BSRT    Medication changes reported      No    Fall or balance concerns reported     No    Tobacco Cessation  No Change    Warm-up and Cool-down  Performed as group-led instruction    Resistance Training Performed  Yes    VAD Patient?  No    PAD/SET Patient?  No      Pain Assessment   Currently in Pain?  No/denies    Multiple Pain Sites  No          Social History   Tobacco Use  Smoking Status Former Smoker  . Packs/day: 1.00  . Years: 30.00  . Pack years: 30.00  . Types: Cigarettes  . Last attempt to quit: 09/12/2007  . Years since quitting: 11.1  Smokeless Tobacco Former Systems developer  . Quit date: 09/11/2012    Goals Met:  Proper associated with RPD/PD & O2 Sat Independence with exercise equipment Exercise tolerated well No report of cardiac concerns or symptoms Strength training completed today  Goals Unmet:  Not Applicable  Comments: Pt able to follow exercise prescription today without complaint.  Will continue to monitor for progression.    Dr. Emily Filbert is Medical Director for [place] and [facility].

## 2018-10-30 DIAGNOSIS — R0602 Shortness of breath: Secondary | ICD-10-CM | POA: Diagnosis not present

## 2018-10-30 DIAGNOSIS — J84112 Idiopathic pulmonary fibrosis: Secondary | ICD-10-CM

## 2018-10-30 NOTE — Progress Notes (Signed)
Daily Session Note  Patient Details  Name: Caleb Townsend MRN: [id number] Date of Birth: 01-Mar-1951 Referring Provider:     Pulmonary Rehab from 08/26/2018 in [facility] Cardiac and Pulmonary Rehab  Referring Provider  Rosana Berger MD      Encounter Date: 10/30/2018  Check In: Session Check In - 10/30/18 1017      Check-In   Supervising physician immediately available to respond to emergencies  [facility] immediately available ER MD    Physician(s)  Joni Fears and Alfred Levins    Location  [facility]-Cardiac & Pulmonary Rehab    Staff Present  Alberteen Sam, MA, RCEP, CCRP, Exercise Physiologist;Joseph Foy [place], [place], ACSM CEP, Exercise Physiologist    Medication changes reported      No    Fall or balance concerns reported     No    Warm-up and Cool-down  Performed as group-led instruction    Resistance Training Performed  Yes    VAD Patient?  No    PAD/SET Patient?  No      Pain Assessment   Currently in Pain?  No/denies    Multiple Pain Sites  No          Social History   Tobacco Use  Smoking Status Former Smoker  . Packs/day: 1.00  . Years: 30.00  . Pack years: 30.00  . Types: Cigarettes  . Last attempt to quit: 09/12/2007  . Years since quitting: 11.1  Smokeless Tobacco Former Systems developer  . Quit date: 09/11/2012    Goals Met:  Independence with exercise equipment Exercise tolerated well No report of cardiac concerns or symptoms Strength training completed today  Goals Unmet:  Not Applicable  Comments: Pt able to follow exercise prescription today without complaint.  Will continue to monitor for progression.    Dr. Emily Filbert is Medical Director for [place] and [facility].

## 2018-11-04 ENCOUNTER — Encounter: Payer: Medicare Other | Admitting: *Deleted

## 2018-11-04 DIAGNOSIS — J84112 Idiopathic pulmonary fibrosis: Secondary | ICD-10-CM

## 2018-11-04 DIAGNOSIS — R0602 Shortness of breath: Secondary | ICD-10-CM | POA: Diagnosis not present

## 2018-11-04 NOTE — Progress Notes (Signed)
Daily Session Note  Patient Details  Name: Caleb Townsend MRN: [id number] Date of Birth: [birth year] Referring Provider:     Pulmonary Rehab from 08/26/2018 in [facility] Card[facility] and Pulmonary Rehab  Referring Provider  Rosana Berger MD      Encounter Date: 11/04/2018  Check In: Session Check In - 11/04/18 1022      Check-In   Supervising physician immediately available to respond to emergencies  [facility] immediately available ER MD    Physician(s)  Jimmye Norman and Burlene Arnt    Location  [facility]-Cardiac & Pulmonary Rehab    Staff Present  Earlean Shawl, BS, ACSM CEP, Exercise Physiologist;Joseph [employer], BA, ACSM CEP, Exercise Physiologist    Medication changes reported      No    Fall or balance concerns reported     No    Tobacco Cessation  No Change    Warm-up and Cool-down  Performed as group-led instruction    Resistance Training Performed  Yes    VAD Patient?  No    PAD/SET Patient?  No      Pain Assessment   Currently in Pain?  No/denies    Multiple Pain Sites  No          Social History   Tobacco Use  Smoking Status Former Smoker  . Packs/day: 1.00  . Years: 30.00  . Pack years: 30.00  . Types: Cigarettes  . Last attempt to quit: 09/12/2007  . Years since quitting: 11.1  Smokeless Tobacco Former Systems developer  . Quit date: 09/11/2012    Goals Met:  Proper associated with RPD/PD & O2 Sat Independence with exercise equipment Exercise tolerated well Personal goals reviewed No report of cardiac concerns or symptoms Strength training completed today  Goals Unmet:  Not Applicable  Comments: Pt able to follow exercise prescription today without complaint.  Will continue to monitor for progression.    Dr. Emily Filbert is Medical Director for [place] and [facility].

## 2018-11-06 DIAGNOSIS — R0602 Shortness of breath: Secondary | ICD-10-CM | POA: Diagnosis not present

## 2018-11-06 DIAGNOSIS — J84112 Idiopathic pulmonary fibrosis: Secondary | ICD-10-CM

## 2018-11-06 NOTE — Progress Notes (Signed)
Daily Session Note  Patient Details  Name: Caleb Townsend MRN: [id number] Date of Birth: Mar 25, 1951 Referring Provider:     Pulmonary Rehab from 08/26/2018 in [facility] Cardiac and Pulmonary Rehab  Referring Provider  Rosana Berger MD      Encounter Date: 11/06/2018  Check In: Session Check In - 11/06/18 1054      Check-In   Supervising physician immediately available to respond to emergencies  [facility] immediately available ER MD    Physician(s)  Cinda Quest and Quentin Cornwall    Location  [facility]-Cardiac & Pulmonary Rehab    Staff Present  Alberteen Sam, MA, RCEP, CCRP, Exercise Physiologist;Joseph Foy [place], [place], ACSM CEP, Exercise Physiologist    Medication changes reported      No    Fall or balance concerns reported     No    Warm-up and Cool-down  Performed as group-led instruction    Resistance Training Performed  Yes    VAD Patient?  No    PAD/SET Patient?  No      Pain Assessment   Currently in Pain?  No/denies    Multiple Pain Sites  No          Social History   Tobacco Use  Smoking Status Former Smoker  . Packs/day: 1.00  . Years: 30.00  . Pack years: 30.00  . Types: Cigarettes  . Last attempt to quit: 09/12/2007  . Years since quitting: 11.1  Smokeless Tobacco Former Systems developer  . Quit date: 09/11/2012    Goals Met:  Proper associated with RPD/PD & O2 Sat Independence with exercise equipment Exercise tolerated well Strength training completed today  Goals Unmet:  Not Applicable  Comments: Pt able to follow exercise prescription today without complaint.  Will continue to monitor for progression.    Dr. Emily Filbert is Medical Director for [place] and [facility].

## 2018-11-08 ENCOUNTER — Encounter: Payer: Medicare Other | Admitting: *Deleted

## 2018-11-08 DIAGNOSIS — J84112 Idiopathic pulmonary fibrosis: Secondary | ICD-10-CM

## 2018-11-08 DIAGNOSIS — R0602 Shortness of breath: Secondary | ICD-10-CM | POA: Diagnosis not present

## 2018-11-08 NOTE — Progress Notes (Signed)
Daily Session Note  Patient Details  Name: Caleb Townsend MRN: [id number] Date of Birth: 1951/06/04 Referring Provider:     Pulmonary Rehab from 08/26/2018 in [facility] Cardiac and Pulmonary Rehab  Referring Provider  Rosana Berger MD      Encounter Date: 11/08/2018  Check In: Session Check In - 11/08/18 1013      Check-In   Supervising physician immediately available to respond to emergencies  [facility] immediately available ER MD    Physician(s)  Drs. Cinda Quest and Kinsey.Precise    Location  [facility]-Cardiac & Pulmonary Rehab    Staff Present  Darel Hong, RN BSN;Meredith Sherryll Burger, RN BSN;Joseph Tessie Fass RCP,RRT,BSRT    Medication changes reported      No    Fall or balance concerns reported     No    Tobacco Cessation  No Change    Warm-up and Cool-down  Performed as group-led instruction    Resistance Training Performed  Yes    VAD Patient?  No    PAD/SET Patient?  No      Pain Assessment   Currently in Pain?  No/denies    Multiple Pain Sites  No          Social History   Tobacco Use  Smoking Status Former Smoker  . Packs/day: 1.00  . Years: 30.00  . Pack years: 30.00  . Types: Cigarettes  . Last attempt to quit: 09/12/2007  . Years since quitting: 11.1  Smokeless Tobacco Former Systems developer  . Quit date: 09/11/2012    Goals Met:  Proper associated with RPD/PD & O2 Sat Independence with exercise equipment Using PLB without cueing & demonstrates good technique Exercise tolerated well Strength training completed today  Goals Unmet:  Not Applicable  Comments: Pt able to follow exercise prescription today without complaint.  Will continue to monitor for progression.    Dr. Emily Filbert is Medical Director for [employer] and [facility].

## 2018-11-11 ENCOUNTER — Encounter: Payer: Medicare Other | Attending: Internal Medicine | Admitting: *Deleted

## 2018-11-11 DIAGNOSIS — J84112 Idiopathic pulmonary fibrosis: Secondary | ICD-10-CM | POA: Diagnosis not present

## 2018-11-11 DIAGNOSIS — R0602 Shortness of breath: Secondary | ICD-10-CM | POA: Insufficient documentation

## 2018-11-11 NOTE — Progress Notes (Signed)
Daily Session Note  Patient Details  Name: Caleb Townsend MRN: [id number] Date of Birth: [birth year] Referring Provider:     Pulmonary Rehab from 08/26/2018 in [facility] Cardiac and Pulmonary Rehab  Referring Provider  Rosana Berger MD      Encounter Date: 11/11/2018  Check In: Session Check In - 11/11/18 1025      Check-In   Supervising physician immediately available to respond to emergencies  [facility] immediately available ER MD    Physician(s)  Dr. Joni Fears and Dr. Alfred Levins    Location  [facility]-Cardiac & Pulmonary Rehab    Staff Present  Earlean Shawl, BS, ACSM CEP, Exercise Physiologist;Joseph [facility], [place], ACSM CEP, Exercise Physiologist    Medication changes reported      No    Fall or balance concerns reported     No    Tobacco Cessation  No Change    Warm-up and Cool-down  Performed as group-led instruction    Resistance Training Performed  Yes    VAD Patient?  No    PAD/SET Patient?  No      Pain Assessment   Currently in Pain?  No/denies    Multiple Pain Sites  No          Social History   Tobacco Use  Smoking Status Former Smoker  . Packs/day: 1.00  . Years: 30.00  . Pack years: 30.00  . Types: Cigarettes  . Last attempt to quit: 09/12/2007  . Years since quitting: 11.1  Smokeless Tobacco Former Systems developer  . Quit date: 09/11/2012    Goals Met:  Proper associated with RPD/PD & O2 Sat Independence with exercise equipment Exercise tolerated well No report of cardiac concerns or symptoms Strength training completed today  Goals Unmet:  Not Applicable  Comments: Pt able to follow exercise prescription today without complaint.  Will continue to monitor for progression.    Dr. Emily Filbert is Medical Director for [place] and [facility].

## 2018-11-13 DIAGNOSIS — R0602 Shortness of breath: Secondary | ICD-10-CM | POA: Diagnosis not present

## 2018-11-13 DIAGNOSIS — J84112 Idiopathic pulmonary fibrosis: Secondary | ICD-10-CM

## 2018-11-13 NOTE — Progress Notes (Signed)
Daily Session Note  Patient Details  Name: Caleb Townsend MRN: [id number] Date of Birth: September 21, 1950 Referring Provider:     Pulmonary Rehab from 08/26/2018 in [facility] Cardiac and Pulmonary Rehab  Referring Provider  Rosana Berger MD      Encounter Date: 11/13/2018  Check In: Session Check In - 11/13/18 1023      Check-In   Supervising physician immediately available to respond to emergencies  [facility] immediately available ER MD    Physician(s)  Dr. Joni Fears and Dr. Corky Downs    Location  [facility]-Cardiac & Pulmonary Rehab    Staff Present  Justin Mend Lorre Nick, MA, RCEP, CCRP, Exercise Physiologist;Amanda Oletta Darter, [place], ACSM CEP, Exercise Physiologist    Medication changes reported      No    Fall or balance concerns reported     No    Warm-up and Cool-down  Performed as group-led instruction    Resistance Training Performed  Yes    VAD Patient?  No    PAD/SET Patient?  No      Pain Assessment   Currently in Pain?  No/denies          Social History   Tobacco Use  Smoking Status Former Smoker  . Packs/day: 1.00  . Years: 30.00  . Pack years: 30.00  . Types: Cigarettes  . Last attempt to quit: 09/12/2007  . Years since quitting: 11.1  Smokeless Tobacco Former Systems developer  . Quit date: 09/11/2012    Goals Met:  Independence with exercise equipment Exercise tolerated well No report of cardiac concerns or symptoms Strength training completed today  Goals Unmet:  Not Applicable  Comments: Pt able to follow exercise prescription today without complaint.  Will continue to monitor for progression.    Dr. Emily Filbert is Medical Director for [place] and [facility].

## 2018-11-18 DIAGNOSIS — R0602 Shortness of breath: Secondary | ICD-10-CM | POA: Diagnosis not present

## 2018-11-18 DIAGNOSIS — J84112 Idiopathic pulmonary fibrosis: Secondary | ICD-10-CM

## 2018-11-18 NOTE — Progress Notes (Signed)
Daily Session Note  Patient Details  Name: Caleb Townsend MRN: [id number] Date of Birth: 1951-08-22 Referring Provider:     Pulmonary Rehab from 08/26/2018 in [facility] Cardiac and Pulmonary Rehab  Referring Provider  Rosana Berger MD      Encounter Date: 11/18/2018  Check In: Session Check In - 11/18/18 [place]      Check-In   Supervising physician immediately available to respond to emergencies  [facility] immediately available ER MD    Physician(s)  Dr. Jimmye Norman and Quentin Cornwall    Location  [facility]-Cardiac & Pulmonary Rehab    Staff Present  Justin Mend RCP,RRT,BSRT;Amanda Oletta [place], [place], ACSM CEP, Exercise Physiologist;Kelly Amedeo Plenty, BS, ACSM CEP, Exercise Physiologist    Medication changes reported      No    Fall or balance concerns reported     No    Warm-up and Cool-down  Performed as group-led instruction    Resistance Training Performed  Yes    VAD Patient?  No    PAD/SET Patient?  No      Pain Assessment   Currently in Pain?  No/denies          Social History   Tobacco Use  Smoking Status Former Smoker  . Packs/day: 1.00  . Years: 30.00  . Pack years: 30.00  . Types: Cigarettes  . Last attempt to quit: 09/12/2007  . Years since quitting: 11.1  Smokeless Tobacco Former Systems developer  . Quit date: 09/11/2012    Goals Met:  Independence with exercise equipment Exercise tolerated well No report of cardiac concerns or symptoms Strength training completed today  Goals Unmet:  Not Applicable  Comments: Pt able to follow exercise prescription today without complaint.  Will continue to monitor for progression.    Dr. Emily Filbert is Medical Director for [place] and [facility].

## 2018-11-18 NOTE — Progress Notes (Signed)
Pulmonary Individual Treatment Plan  Patient Details  Name: Caleb Townsend MRN: [id number] Date of Birth: June 25, 1951 Referring Provider:     Pulmonary Rehab from 08/26/2018 in [facility] Cardiac and Pulmonary Rehab  Referring Provider  Rosana Berger MD      Initial Encounter Date:    Pulmonary Rehab from 08/26/2018 in [facility] Cardiac and Pulmonary Rehab  Date  08/26/18      Visit Diagnosis: IPF (idiopathic pulmonary fibrosis) ([place])  Patient's Home Medications on Admission: No current outpatient medications on file.  Past Medical History: No past medical history on file.  Tobacco Use: Social History   Tobacco Use  Smoking Status Former Smoker  . Packs/day: 1.00  . Years: 30.00  . Pack years: 30.00  . Types: Cigarettes  . Last attempt to quit: 09/12/2007  . Years since quitting: 11.1  Smokeless Tobacco Former Systems developer  . Quit date: 09/11/2012    Labs: Recent Review Flowsheet Data    There is no flowsheet data to display.       Pulmonary Assessment Scores: Pulmonary Assessment Scores    Row Name 08/26/18 1431         ADL UCSD   ADL Phase  Entry     SOB Score total  51     Rest  0     Walk  2     Stairs  4     Bath  3     Dress  2     Shop  1       CAT Score   CAT Score  24       mMRC Score   mMRC Score  2        Pulmonary Function Assessment: Pulmonary Function Assessment - 08/26/18 1452      Breath   Bilateral Breath Sounds  Clear;Decreased    Shortness of Breath  Yes;Limiting activity       Exercise Target Goals: Exercise Program Goal: Individual exercise prescription set using results from initial 6 min walk test and THRR while considering  patient's activity barriers and safety.   Exercise Prescription Goal: Initial exercise prescription builds to 30-45 minutes a day of aerobic activity, 2-3 days per week.  Home exercise guidelines will be given to patient during program as part of exercise prescription that the participant will  acknowledge.  Activity Barriers & Risk Stratification: Activity Barriers & Cardiac Risk Stratification - 08/26/18 1459      Activity Barriers & Cardiac Risk Stratification   Activity Barriers  Deconditioning;Shortness of Breath;Back Problems   previous back surgery x2      6 Minute Walk: 6 Minute Walk    Row Name 08/26/18 1458         6 Minute Walk   Phase  Initial     Distance  1410 feet     Walk Time  6 minutes     # of Rest Breaks  0     MPH  2.67     METS  4.06     RPE  12     Perceived Dyspnea   2     VO2 Peak  14.22     Symptoms  Yes (comment)     Comments  SOB     Resting HR  107 bpm     Resting BP  126/70     Resting Oxygen Saturation   95 %     Exercise Oxygen Saturation  during 6 min walk  87 %  Max Ex. HR  134 bpm     Max Ex. BP  164/84     2 Minute Post BP  126/72       Interval HR   1 Minute HR  110     2 Minute HR  109     3 Minute HR  109     4 Minute HR  109     5 Minute HR  109     6 Minute HR  134     2 Minute Post HR  115     Interval Heart Rate?  Yes       Interval Oxygen   Interval Oxygen?  Yes     Baseline Oxygen Saturation %  95 %     1 Minute Oxygen Saturation %  92 %     1 Minute Liters of Oxygen  0 L Room Air     2 Minute Oxygen Saturation %  88 %     2 Minute Liters of Oxygen  0 L     3 Minute Oxygen Saturation %  88 %     3 Minute Liters of Oxygen  0 L     4 Minute Oxygen Saturation %  87 %     4 Minute Liters of Oxygen  0 L     5 Minute Oxygen Saturation %  88 %     5 Minute Liters of Oxygen  0 L     6 Minute Oxygen Saturation %  87 %     6 Minute Liters of Oxygen  0 L     2 Minute Post Oxygen Saturation %  95 %     2 Minute Post Liters of Oxygen  0 L       Oxygen Initial Assessment: Oxygen Initial Assessment - 08/26/18 1452      Home Oxygen   Home Oxygen Device  None    Sleep Oxygen Prescription  None    Home Exercise Oxygen Prescription  None    Home at Rest Exercise Oxygen Prescription  None      Initial 6  min Walk   Oxygen Used  None      Program Oxygen Prescription   Program Oxygen Prescription  None      Intervention   Short Term Goals  To learn and demonstrate proper use of respiratory medications;To learn and demonstrate proper pursed lip breathing techniques or other breathing techniques.;To learn and understand importance of maintaining oxygen saturations>88%;To learn and understand importance of monitoring SPO2 with pulse oximeter and demonstrate accurate use of the pulse oximeter.    Long  Term Goals  Maintenance of O2 saturations>88%;Verbalizes importance of monitoring SPO2 with pulse oximeter and return demonstration;Exhibits proper breathing techniques, such as pursed lip breathing or other method taught during program session;Compliance with respiratory medication;Demonstrates proper use of MDI's       Oxygen Re-Evaluation: Oxygen Re-Evaluation    Row Name 09/16/18 1025 09/27/18 1044 11/04/18 1104         Program Oxygen Prescription   Program Oxygen Prescription  None  None  Continuous     Liters per minute  -  -  3       Home Oxygen   Home Oxygen Device  None  Home Concentrator uses PRN when he has exerted himself  Home Concentrator     Sleep Oxygen Prescription  None  None  None     Home Exercise Oxygen Prescription  None  None  None     Home at Rest Exercise Oxygen Prescription  None  None  None     Compliance with Home Oxygen Use  -  Yes  Yes       Goals/Expected Outcomes   Short Term Goals  To learn and demonstrate proper use of respiratory medications;To learn and demonstrate proper pursed lip breathing techniques or other breathing techniques.;To learn and understand importance of maintaining oxygen saturations>88%;To learn and understand importance of monitoring SPO2 with pulse oximeter and demonstrate accurate use of the pulse oximeter.  To learn and demonstrate proper use of respiratory medications;To learn and demonstrate proper pursed lip breathing techniques or  other breathing techniques.;To learn and understand importance of maintaining oxygen saturations>88%;To learn and understand importance of monitoring SPO2 with pulse oximeter and demonstrate accurate use of the pulse oximeter.  To learn and demonstrate proper use of respiratory medications;To learn and demonstrate proper pursed lip breathing techniques or other breathing techniques.;To learn and understand importance of maintaining oxygen saturations>88%;To learn and understand importance of monitoring SPO2 with pulse oximeter and demonstrate accurate use of the pulse oximeter.     Long  Term Goals  Maintenance of O2 saturations>88%;Verbalizes importance of monitoring SPO2 with pulse oximeter and return demonstration;Exhibits proper breathing techniques, such as pursed lip breathing or other method taught during program session;Compliance with respiratory medication;Demonstrates proper use of MDI's  Maintenance of O2 saturations>88%;Verbalizes importance of monitoring SPO2 with pulse oximeter and return demonstration;Exhibits proper breathing techniques, such as pursed lip breathing or other method taught during program session;Compliance with respiratory medication;Demonstrates proper use of MDI's  Maintenance of O2 saturations>88%;Verbalizes importance of monitoring SPO2 with pulse oximeter and return demonstration;Exhibits proper breathing techniques, such as pursed lip breathing or other method taught during program session;Compliance with respiratory medication;Demonstrates proper use of MDI's     Comments  Reviewed PLB technique with pt.  Talked about how it work and it's important to maintaining his exercise saturations.    Patient has been using PLB and he states that it has been helping him. His doctor ordered him a portable unit and is in the process of getting one. He has a concentrator at home but does not use it when he is at rest.  Patient did get his portable unit for home and uses it any time he  is doing any activity. He is currently using about 4 liters at home. He is using PLB but does feel like his SOB is continuing to get worse.      Goals/Expected Outcomes  Short: Become more profiecient at using PLB.   Long: Become independent at using PLB.  Short: obtain a portable oxygen concentrator. Long: use portable oxygen concentrator as prescribed.  Short: use portable oxygen at home as needed. Long: [facility] program to obtain a lung transplant.         Oxygen Discharge (Final Oxygen Re-Evaluation): Oxygen Re-Evaluation - 11/04/18 1104      Program Oxygen Prescription   Program Oxygen Prescription  Continuous    Liters per minute  3      Home Oxygen   Home Oxygen Device  Home Concentrator    Sleep Oxygen Prescription  None    Home Exercise Oxygen Prescription  None    Home at Rest Exercise Oxygen Prescription  None    Compliance with Home Oxygen Use  Yes      Goals/Expected Outcomes   Short Term Goals  To learn and demonstrate proper use of respiratory medications;To  learn and demonstrate proper pursed lip breathing techniques or other breathing techniques.;To learn and understand importance of maintaining oxygen saturations>88%;To learn and understand importance of monitoring SPO2 with pulse oximeter and demonstrate accurate use of the pulse oximeter.    Long  Term Goals  Maintenance of O2 saturations>88%;Verbalizes importance of monitoring SPO2 with pulse oximeter and return demonstration;Exhibits proper breathing techniques, such as pursed lip breathing or other method taught during program session;Compliance with respiratory medication;Demonstrates proper use of MDI's    Comments  Patient did get his portable unit for home and uses it any time he is doing any activity. He is currently using about 4 liters at home. He is using PLB but does feel like his SOB is continuing to get worse.     Goals/Expected Outcomes  Short: use portable oxygen at home as needed. Long: [facility] Transplant program to obtain a lung transplant.        Initial Exercise Prescription: Initial Exercise Prescription - 08/26/18 1500      Date of Initial Exercise RX and Referring Provider   Date  08/26/18    Referring Provider  Rosana Berger MD      Treadmill   MPH  2.5    Grade  1    Minutes  15    METs  3.26      Recumbant Elliptical   Level  2    RPM  50    Minutes  15    METs  3      T5 Nustep   Level  3    SPM  80    Minutes  15    METs  3.2      Prescription Details   Frequency (times per week)  3    Duration  Progress to 45 minutes of aerobic exercise without signs/symptoms of physical distress      Intensity   THRR 40-80% of Max Heartrate  128/143    Ratings of Perceived Exertion  11-13    Perceived Dyspnea  0-4      Progression   Progression  Continue to progress workloads to maintain intensity without signs/symptoms of physical distress.      Resistance Training   Training Prescription  Yes    Weight  4 lbs    Reps  10-15       Perform Capillary Blood Glucose checks as needed.  Exercise Prescription Changes: Exercise Prescription Changes    Row Name 08/26/18 1500 09/25/18 1500 10/10/18 0900 10/22/18 1000 11/06/18 1300     Response to Exercise   Blood Pressure (Admit)  126/70  122/78  118/66  104/62  124/72   Blood Pressure (Exercise)  164/84  140/64  -  -  -   Blood Pressure (Exit)  126/72  122/60  110/68  112/62  100/60   Heart Rate (Admit)  107 bpm  66 bpm  102 bpm  104 bpm  94 bpm   Heart Rate (Exercise)  134 bpm  128 bpm  123 bpm  122 bpm  113 bpm   Heart Rate (Exit)  115 bpm  111 bpm  111 bpm  121 bpm  100 bpm   Oxygen Saturation (Admit)  95 %  97 %  93 %  93 %  96 %   Oxygen Saturation (Exercise)  87 %  87 %  86 %  89 %  86 %   Oxygen Saturation (Exit)  95 %  92 %  91 %  91 %  88 %   Rating of Perceived Exertion (Exercise)  '12  13  13  12  12   '$ Perceived Dyspnea (Exercise)  '2  2  3  2  3   '$ Symptoms  SOB  none  none  none   none   Comments  walk test results  4 th session  -  -  -   Duration  -  Continue with 45 min of aerobic exercise without signs/symptoms of physical distress.  Continue with 45 min of aerobic exercise without signs/symptoms of physical distress.  Continue with 45 min of aerobic exercise without signs/symptoms of physical distress.  Continue with 45 min of aerobic exercise without signs/symptoms of physical distress.   Intensity  -  THRR unchanged  THRR unchanged  THRR unchanged  THRR unchanged     Progression   Progression  -  Continue to progress workloads to maintain intensity without signs/symptoms of physical distress.  Continue to progress workloads to maintain intensity without signs/symptoms of physical distress.  Continue to progress workloads to maintain intensity without signs/symptoms of physical distress.  Continue to progress workloads to maintain intensity without signs/symptoms of physical distress.   Average METs  -  2.5  2.6  2.4  2.6     Resistance Training   Training Prescription  -  Yes  Yes  Yes  Yes   Weight  -  4 lb  4 lb  4 lb  4 lb   Reps  -  10-15  10-15  10-15  10-15     Interval Training   Interval Training  -  No  No  No  -     Oxygen   Oxygen  -  -  -  Continuous  Continuous   Liters  -  -  -  '3  3 5 '$ on TM     Treadmill   MPH  -  2.5  2.5  2.5  2.5   Grade  -  '1  1  1  1   '$ Minutes  -  '15  15  15  15   '$ METs  -  3.26  3.26  3.26  3.26     Recumbant Elliptical   Level  -  '3  3  3  3   '$ RPM  -  50  50  50  50   Minutes  -  '15  15  15  15   '$ METs  -  2.2  2.3  1.8  2.2     T5 Nustep   Level  -  '3  3  3  3   '$ SPM  -  80  80  80  80   Minutes  -  '15  15  15  15   '$ METs  -  2  2  2.1  2      Exercise Comments: Exercise Comments    Row Name 09/16/18 1024           Exercise Comments   First full day of exercise!  Patient was oriented to gym and equipment including functions, settings, policies, and procedures.  Patient's individual exercise prescription  and treatment plan were reviewed.  All starting workloads were established based on the results of the 6 minute walk test done at initial orientation visit.  The plan for exercise progression was also introduced and progression will be customized based on patient's performance and goals.  Exercise Goals and Review: Exercise Goals    Row Name 08/26/18 1502             Exercise Goals   Increase Physical Activity  Yes       Intervention  Provide advice, education, support and counseling about physical activity/exercise needs.;Develop an individualized exercise prescription for aerobic and resistive training based on initial evaluation findings, risk stratification, comorbidities and participant's personal goals.       Expected Outcomes  Short Term: Attend rehab on a regular basis to increase amount of physical activity.;Long Term: Add in home exercise to make exercise part of routine and to increase amount of physical activity.;Long Term: Exercising regularly at least 3-5 days a week.       Increase Strength and Stamina  Yes       Intervention  Provide advice, education, support and counseling about physical activity/exercise needs.;Develop an individualized exercise prescription for aerobic and resistive training based on initial evaluation findings, risk stratification, comorbidities and participant's personal goals.       Expected Outcomes  Short Term: Increase workloads from initial exercise prescription for resistance, speed, and METs.;Short Term: Perform resistance training exercises routinely during rehab and add in resistance training at home;Long Term: Improve cardiorespiratory fitness, muscular endurance and strength as measured by increased METs and functional capacity (6MWT)       Able to understand and use rate of perceived exertion (RPE) scale  Yes       Intervention  Provide education and explanation on how to use RPE scale       Expected Outcomes  Short Term: Able to use RPE  daily in rehab to express subjective intensity level;Long Term:  Able to use RPE to guide intensity level when exercising independently       Knowledge and understanding of Target Heart Rate Range (THRR)  Yes       Intervention  Provide education and explanation of THRR including how the numbers were predicted and where they are located for reference       Expected Outcomes  Short Term: Able to state/look up THRR;Long Term: Able to use THRR to govern intensity when exercising independently;Short Term: Able to use daily as guideline for intensity in rehab       Able to check pulse independently  Yes       Intervention  Provide education and demonstration on how to check pulse in carotid and radial arteries.;Review the importance of being able to check your own pulse for safety during independent exercise       Expected Outcomes  Short Term: Able to explain why pulse checking is important during independent exercise;Long Term: Able to check pulse independently and accurately       Understanding of Exercise Prescription  Yes       Intervention  Provide education, explanation, and written materials on patient's individual exercise prescription       Expected Outcomes  Short Term: Able to explain program exercise prescription;Long Term: Able to explain home exercise prescription to exercise independently          Exercise Goals Re-Evaluation : Exercise Goals Re-Evaluation    Row Name 09/16/18 1024 09/25/18 1542 09/27/18 1032 10/10/18 0947 10/22/18 1012     Exercise Goal Re-Evaluation   Exercise Goals Review  Increase Physical Activity;Increase Strength and Stamina;Able to understand and use rate of perceived exertion (RPE) scale;Knowledge and understanding of Target Heart Rate Range (THRR);Understanding of Exercise Prescription;Able to understand and use Dyspnea scale  Increase Physical Activity;Increase Strength and Stamina;Able to understand and use rate of perceived exertion (RPE) scale;Able to  understand and use Dyspnea scale;Knowledge and understanding of Target Heart Rate Range (THRR);Understanding of Exercise Prescription  Increase Physical Activity;Able to understand and use rate of perceived exertion (RPE) scale;Knowledge and understanding of Target Heart Rate Range (THRR);Understanding of Exercise Prescription;Increase Strength and Stamina;Able to understand and use Dyspnea scale;Able to check pulse independently  Increase Physical Activity;Increase Strength and Stamina;Able to understand and use rate of perceived exertion (RPE) scale;Able to understand and use Dyspnea scale;Understanding of Exercise Prescription  Increase Physical Activity;Increase Strength and Stamina;Able to understand and use rate of perceived exertion (RPE) scale;Able to understand and use Dyspnea scale;Knowledge and understanding of Target Heart Rate Range (THRR);Able to check pulse independently;Understanding of Exercise Prescription   Comments  Reviewed RPE scale, THR and program prescription with pt today.  Pt voiced understanding and was given a copy of goals to take home.   Caleb Townsend has tolerated exercise well.  He needs supplemental oxygen on the TM only.  Staff will monitor progress.   Reviewed home exercise with pt today.  Pt plans to go to [facility] for exercise.  Reviewed THR, pulse, RPE, sign and symptoms, NTG use, and when to call 911 or MD.  Also discussed weather considerations and indoor options.  Pt voiced understanding.  Caleb Townsend continues to tolerate exercise well.  He has increased 02 on TM to 3 L to maintain 02 above 88%.    Caleb Townsend  tolerates exercise well.  He has been through preliminary testing at [facility] for lung transplant.  Staff will monitor progress.   Expected Outcomes  Short: Use RPE daily to regulate intensity. Long: Follow program prescription in THR.  Short - attend classs consistently Long - increase MET level  Short - check oxygen when exercising outside program classes one day per week Long -  maintain exercise independently  Short - increase levels on seated machines Long - increase overall MET level  Short - continue to attend consistently Long - increase MET level   Row Name 11/04/18 1106 11/06/18 1307           Exercise Goal Re-Evaluation   Exercise Goals Review  Increase Physical Activity;Increase Strength and Stamina;Able to understand and use rate of perceived exertion (RPE) scale;Knowledge and understanding of Target Heart Rate Range (THRR);Understanding of Exercise Prescription;Able to understand and use Dyspnea scale  Increase Physical Activity;Increase Strength and Stamina;Able to understand and use rate of perceived exertion (RPE) scale;Able to understand and use Dyspnea scale;Knowledge and understanding of Target Heart Rate Range (THRR);Understanding of Exercise Prescription      Comments  Caleb Townsend does tolerate  exercise during class well, but states that his SOB continues to be his major limiting factor. He stated that he feels like physically he could push himself more if the SOB was not so bad. He will go for his 5 day evaluation with the [facility] transplant team starting March 19th, and if all goes well will start with [facility] rehab until lungs become available. Caleb Townsend attends consistently and works as much as he is able.  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      Expected Outcomes  Short: continue to attend [facility] regularly. 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   Short - continue to attend consistently Long - meet exercise goals for transplant  Discharge Exercise Prescription (Final Exercise Prescription Changes): Exercise Prescription Changes - 11/06/18 1300      Response to Exercise   Blood Pressure (Admit)  124/72    Blood Pressure (Exit)  100/60    Heart Rate (Admit)  94 bpm    Heart Rate (Exercise)  113 bpm    Heart Rate (Exit)  100 bpm    Oxygen Saturation (Admit)  96 %     Oxygen Saturation (Exercise)  86 %    Oxygen Saturation (Exit)  88 %    Rating of Perceived Exertion (Exercise)  12    Perceived Dyspnea (Exercise)  3    Symptoms  none    Duration  Continue with 45 min of aerobic exercise without signs/symptoms of physical distress.    Intensity  THRR unchanged      Progression   Progression  Continue to progress workloads to maintain intensity without signs/symptoms of physical distress.    Average METs  2.6      Resistance Training   Training Prescription  Yes    Weight  4 lb    Reps  10-15      Oxygen   Oxygen  Continuous    Liters  3   5 on TM     Treadmill   MPH  2.5    Grade  1    Minutes  15    METs  3.26      Recumbant Elliptical   Level  3    RPM  50    Minutes  15    METs  2.2      T5 Nustep   Level  3    SPM  80    Minutes  15    METs  2       Nutrition:  Target Goals: Understanding of nutrition guidelines, daily intake of sodium '1500mg'$ , cholesterol '200mg'$ , calories 30% from fat and 7% or less from saturated fats, daily to have 5 or more servings of fruits and vegetables.  Biometrics: Pre Biometrics - 08/26/18 1502      Pre Biometrics   Height  5' 11.25" (1.81 m)    Weight  183 lb 8 oz (83.2 kg)    Waist Circumference  34 inches    Hip Circumference  39 inches    Waist to Hip Ratio  0.87 %    BMI (Calculated)  25.41        Nutrition Therapy Plan and Nutrition Goals: Nutrition Therapy & Goals - 09/18/18 1357      Nutrition Therapy   Diet  TLC    Protein (specify units)  8oz    Fiber  35 grams    Whole Grain Foods  3 servings   does not typically choose whole grains   Saturated Fats  14 max. grams    Fruits and Vegetables  6 servings/day   8 ideal, tries to eat fruits/ vegetables daily   Sodium  1500 grams      Personal Nutrition Goals   Nutrition Goal  Continue to work on decreasing the amount of sugar added to beverages like sweet tea, great job for getting started with this!    Personal Goal  #2  Select lean cuts / options for red meats since you eat them more often than most other protein sources    Comments  States he tries to eat a diet that is lower in fat and cholesterol. He has been using honey to sweeten things like  coffee rather than table sugar and has been eating breakfast consistently d/t taking a new medication. Breakfast: cereal with 2% milk, yogurt, sometimes eggs / peanut butter. Lunch: (out 2-3x/wk) burger, [employer] sub, [place] sandwich on white bread, leftovers. Dinner: (out 1-2x/wk to [employer]) boston butt, squash, corn, steak, baked potato, salad, fish (baked or fried occasionally), pork, shrimp, some chicken but does not prefer it, spaghetti with ground beef. Snacks: fruit, ice cream occasionally. Beverages: water, sweet tea, used to drink beer when golfing but has been unable to golf as of late. His wife does not cook with salt but he does add salt at the table      Intervention Plan   Intervention  Prescribe, educate and counsel regarding individualized specific dietary modifications aiming towards targeted core components such as weight, hypertension, lipid management, diabetes, heart failure and other comorbidities.    Expected Outcomes  Short Term Goal: Understand basic principles of dietary content, such as calories, fat, sodium, cholesterol and nutrients.;Short Term Goal: A plan has been developed with personal nutrition goals set during dietitian appointment.;Long Term Goal: Adherence to prescribed nutrition plan.       Nutrition Assessments: Nutrition Assessments - 08/26/18 1434      MEDFICTS Scores   Pre Score  43       Nutrition Goals Re-Evaluation: Nutrition Goals Re-Evaluation    Row Name 09/18/18 1144 09/18/18 1427 10/16/18 1407 11/04/18 1058       Goals   Nutrition Goal  Lose a little weight  Select lean cuts/ options for red meats since you eat them more often than most other protein sources  Get blood sugar levels lower and improve LDL readings.   -    Comment  Patient would like to meet with the dietician. He wants to lose a little bit of weight. His diet is important since he has breathing issues and may be wise to find out if he is getting enough nutrition  He prefers red meats over chicken/ turkey/ seafood and eats them multiple times per week  Patient states that his LDL slightly elevated and so was his blood sugar. He is trying to drink more water. With his LDL being a little high he is eating healthier fats like avocado.  Caleb Townsend stated that he is not drinking any soft drinks and has switched to 1/2 and 1/2 tea instead of sweet tea and is trying to drink more water. Caleb Townsend also stated that he has been eating less candy and sweets.     Expected Outcome  Short: meet with dietician. Long: adhere to a diet plan.  He will choose lean meats overall, especially for red meats   Short: work on eating lower cholesterol foods. Long: improve LDL value.  Short: continue to try to drink  more water and work on lower cholesterol foods. Long: improve LDL and maintain weight.      Personal Goal #2 Re-Evaluation   Personal Goal #2  -  Continue to work on decreasing the amount of sugar added to beverages like sweet tea, great job for getting started with this!  -  -       Nutrition Goals Discharge (Final Nutrition Goals Re-Evaluation): Nutrition Goals Re-Evaluation - 11/04/18 [place] stated that he is not drinking any soft drinks and has switched to 1/2 and 1/2 tea instead of sweet tea and is trying to drink more water. Caleb Townsend also stated that he has been eating less  candy and sweets.     Expected Outcome  Short: continue to try to drink  more water and work on lower cholesterol foods. Long: improve LDL and maintain weight.       Psychosocial: Target Goals: Acknowledge presence or absence of significant depression and/or stress, maximize coping skills, provide positive support system. Participant is able to verbalize types and  ability to use techniques and skills needed for reducing stress and depression.   Initial Review & Psychosocial Screening: Initial Psych Review & Screening - 08/26/18 1453      Initial Review   Current issues with  None Identified      Family Dynamics   Good Support System?  Yes    Comments  He can look to his wife,2 daughters for support and close friends.      Barriers   Psychosocial barriers to participate in program  There are no identifiable barriers or psychosocial needs.;The patient should benefit from training in stress management and relaxation.      Screening Interventions   Interventions  Encouraged to exercise;Program counselor consult;To provide support and resources with identified psychosocial needs;Provide feedback about the scores to participant    Expected Outcomes  Short Term goal: Utilizing psychosocial counselor, staff and physician to assist with identification of specific Stressors or current issues interfering with healing process. Setting desired goal for each stressor or current issue identified.;Long Term Goal: Stressors or current issues are controlled or eliminated.;Short Term goal: Identification and review with participant of any Quality of Life or Depression concerns found by scoring the questionnaire.;Long Term goal: The participant improves quality of Life and PHQ9 Scores as seen by post scores and/or verbalization of changes       Quality of Life Scores:  Scores of 19 and below usually indicate a poorer quality of life in these areas.  A difference of  2-3 points is a clinically meaningful difference.  A difference of 2-3 points in the total score of the Quality of Life Index has been associated with significant improvement in overall quality of life, self-image, physical symptoms, and general health in studies assessing change in quality of life.  PHQ-9: Recent Review Flowsheet Data    Depression screen [facility] 2/9 08/26/2018   Decreased Interest 1   Down,  Depressed, Hopeless 0   PHQ - 2 Score 1   Altered sleeping 1   Tired, decreased energy 2   Change in appetite 0   Feeling bad or failure about yourself  0   Trouble concentrating 0   Moving slowly or fidgety/restless 0   Suicidal thoughts 0   PHQ-9 Score 4   Difficult doing work/chores Somewhat difficult     Interpretation of Total Score  Total Score Depression Severity:  1-4 = Minimal depression, 5-9 = Mild depression, 10-14 = Moderate depression, 15-19 = Moderately severe depression, 20-27 = Severe depression   Psychosocial Evaluation and Intervention: Psychosocial Evaluation - 09/16/18 1037      Psychosocial Evaluation & Interventions   Interventions  Stress management education;Encouraged to exercise with the program and follow exercise prescription    Comments  Counselor met with Mr. [facility]) today for initial psychsocial evaluation.  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  Caleb Townsend has a strong support system with a spouse of 80 years; (2) adult daughters in neighboring communities; good friends and active involvement in his local church.  He has a history of prostate cancer in 2013 and has remained cancer free  since that time.  Caleb Townsend sleeps well and has a good appetite.  He denies a history of depression or anxiety and is typically in a positive mood.  His health is his primary stressor at this time.  He has goals to improve his breathing so that he can possibly be a transplant recipient.  Caleb Townsend meets with a transplant team on 2/3 & 2/4 to be evaluated for this.  He will be followed by staff here.    Expected Outcomes  Short:  Caleb Townsend will exercise according to his plan to improve his breathing and increase his stamina in order to be a possible transplant recipient.  Long:  Caleb Townsend will continue to make positive self-care choices and maintain a positive attitude as he moves forward towards the transplant evaluation.      Continue Psychosocial Services   Follow up required  by staff       Psychosocial Re-Evaluation: Psychosocial Re-Evaluation    [place] Name 10/02/18 1119 11/04/18 1051           Psychosocial Re-Evaluation   Current issues with  Current Stress Concerns  Current Stress Concerns      Comments  Counselor followed with Caleb Townsend today reporting his breathing has "gotten worse" with lower oxygen levels.  He has not noticed any progress in his strength either but was proud to have lost a few pounds and eating healthier - cutting out snacks.  Caleb Townsend continues to have a positive attitude and relies on his family and faith for stress in his life.  He will be meeting with the lung transplant team early February and is hoping for new lungs soon!  Counselor commended Tasley on his progress made and his commitment to this process and his health.  Staff will continue to follow.  Patient stated that he is doing well emotionally, reporting no depression, but just a little frustration that he is not able to do they things he wants to do. He did state that he feels like his SOB is getting worse but hoping that if all goes through with his transplant that he will once again be able to do more in his daily life. He states he feels he has a strong support system with his family and his faith that is helping him though this.        Expected Outcomes  Short:  Caleb Townsend will continue to exercise consistently for his stamina and strength.  Long:  Caleb Townsend will hopefully get "new lungs" in the next month and be able to return to exercising for follow up/rehabilitation.   Short: Caleb Townsend will have his 5 day physical with the [facility] Transplant Team starting on March 19. Long: Caleb Townsend will be eligable and be able to have a lung transplant.       Continue Psychosocial Services   Follow up required by staff  Follow up required by staff         Psychosocial Discharge (Final Psychosocial Re-Evaluation): Psychosocial Re-Evaluation - 11/04/18 1051      Psychosocial Re-Evaluation   Current issues with   Current Stress Concerns    Comments  Patient stated that he is doing well emotionally, reporting no depression, but just a little frustration that he is not able to do they things he wants to do. He did state that he feels like his SOB is getting worse but hoping that if all goes through with his transplant that he will once again be able to do more in his daily life. He states  he feels he has a strong support system with his family and his faith that is helping him though this.      Expected Outcomes  Short: Caleb Townsend will have his 5 day physical with the [facility] Transplant Team starting on March 19. Long: Caleb Townsend will be eligable and be able to have a lung transplant.     Continue Psychosocial Services   Follow up required by staff       Education: Education Goals: Education classes will be provided on a weekly basis, covering required topics. Participant will state understanding/return demonstration of topics presented.  Learning Barriers/Preferences: Learning Barriers/Preferences - 08/26/18 1456      Learning Barriers/Preferences   Learning Barriers  None    Learning Preferences  None       Education Topics:  Initial Evaluation Education: - Verbal, written and demonstration of respiratory meds, oximetry and breathing techniques. Instruction on use of nebulizers and MDIs and importance of monitoring MDI activations.   Pulmonary Rehab from 11/13/2018 in [facility] Cardiac and Pulmonary Rehab  Date  08/26/18  Educator  [facility]  Instruction Review Code  1- Verbalizes Understanding      General Nutrition Guidelines/Fats and Fiber: -Group instruction provided by verbal, written material, models and posters to present the general guidelines for heart healthy nutrition. Gives an explanation and review of dietary fats and fiber.   Pulmonary Rehab from 11/13/2018 in [facility] Cardiac and Pulmonary Rehab  Date  10/23/18  Educator  [facility]  Instruction Review Code  1- Verbalizes Understanding      Controlling  Sodium/Reading Food Labels: -Group verbal and written material supporting the discussion of sodium use in heart healthy nutrition. Review and explanation with models, verbal and written materials for utilization of the food label.   Pulmonary Rehab from 11/13/2018 in [facility] Cardiac and Pulmonary Rehab  Date  10/30/18  Educator  [facility]  Instruction Review Code  1- Verbalizes Understanding      Exercise Physiology & General Exercise Guidelines: - Group verbal and written instruction with models to review the exercise physiology of the cardiovascular system and associated critical values. Provides general exercise guidelines with specific guidelines to those with heart or lung disease.    Pulmonary Rehab from 11/13/2018 in [facility] Cardiac and Pulmonary Rehab  Date  10/02/18  Educator  [facility]  Instruction Review Code  1- Verbalizes Understanding      Aerobic Exercise & Resistance Training: - Gives group verbal and written instruction on the various components of exercise. Focuses on aerobic and resistive training programs and the benefits of this training and how to safely progress through these programs.   Pulmonary Rehab from 11/13/2018 in [facility] Cardiac and Pulmonary Rehab  Date  10/04/18  Educator  [facility]  Instruction Review Code  1- Verbalizes Understanding      Flexibility, Balance, Mind/Body Relaxation: Provides group verbal/written instruction on the benefits of flexibility and balance training, including mind/body exercise modes such as yoga, pilates and tai chi.  Demonstration and skill practice provided.   Pulmonary Rehab from 11/13/2018 in [facility] Cardiac and Pulmonary Rehab  Date  10/09/18  Educator  [facility]  Instruction Review Code  1- Verbalizes Understanding      Stress and Anxiety: - Provides group verbal and written instruction about the health risks of elevated stress and causes of high stress.  Discuss the correlation between heart/lung disease and anxiety and treatment options. Review healthy ways  to manage with stress and anxiety.   Pulmonary Rehab from 11/13/2018 in [facility] Cardiac  and Pulmonary Rehab  Date  10/16/18  Educator  [facility]  Instruction Review Code  1- Verbalizes Understanding      Depression: - Provides group verbal and written instruction on the correlation between heart/lung disease and depressed mood, treatment options, and the stigmas associated with seeking treatment.   Pulmonary Rehab from 11/13/2018 in [facility] Cardiac and Pulmonary Rehab  Date  11/13/18  Educator  [facility]  Instruction Review Code  1- Verbalizes Understanding      Exercise & Equipment Safety: - Individual verbal instruction and demonstration of equipment use and safety with use of the equipment.   Pulmonary Rehab from 11/13/2018 in [facility] Cardiac and Pulmonary Rehab  Date  08/26/18  Educator  [facility]  Instruction Review Code  1- Verbalizes Understanding      Infection Prevention: - Provides verbal and written material to individual with discussion of infection control including proper hand washing and proper equipment cleaning during exercise session.   Pulmonary Rehab from 11/13/2018 in [facility] Cardiac and Pulmonary Rehab  Date  08/26/18  Educator  [facility]  Instruction Review Code  1- Verbalizes Understanding      Falls Prevention: - Provides verbal and written material to individual with discussion of falls prevention and safety.   Pulmonary Rehab from 11/13/2018 in [facility] Cardiac and Pulmonary Rehab  Date  08/26/18  Educator  [facility]  Instruction Review Code  1- Verbalizes Understanding      Diabetes: - Individual verbal and written instruction to review signs/symptoms of diabetes, desired ranges of glucose level fasting, after meals and with exercise. Advice that pre and post exercise glucose checks will be done for 3 sessions at entry of program.   Chronic Lung Diseases: - Group verbal and written instruction to review updates, respiratory medications, advancements in procedures and treatments. Discuss use of  supplemental oxygen including available portable oxygen systems, continuous and intermittent flow rates, concentrators, personal use and safety guidelines. Review proper use of inhaler and spacers. Provide informative websites for self-education.    Pulmonary Rehab from 11/13/2018 in [facility] Cardiac and Pulmonary Rehab  Date  10/18/18  Educator  [facility]  Instruction Review Code  1- Verbalizes Understanding      Energy Conservation: - Provide group verbal and written instruction for methods to conserve energy, plan and organize activities. Instruct on pacing techniques, use of adaptive equipment and posture/positioning to relieve shortness of breath.   Triggers and Exacerbations: - Group verbal and written instruction to review types of environmental triggers and ways to prevent exacerbations. Discuss weather changes, air quality and the benefits of nasal washing. Review warning signs and symptoms to help prevent infections. Discuss techniques for effective airway clearance, coughing, and vibrations.   AED/CPR: - Group verbal and written instruction with the use of models to demonstrate the basic use of the AED with the basic ABC's of resuscitation.   Pulmonary Rehab from 11/13/2018 in [facility] Cardiac and Pulmonary Rehab  Date  09/25/18  Educator  [facility]  Instruction Review Code  5- Refused Teaching      Anatomy and Physiology of the Lungs: - Group verbal and written instruction with the use of models to provide basic lung anatomy and physiology related to function, structure and complications of lung disease.   Anatomy & Physiology of the Heart: - Group verbal and written instruction and models provide basic cardiac anatomy and physiology, with the coronary electrical and arterial systems. Review of Valvular disease and Heart Failure   Cardiac Medications: - Group verbal and written instruction to  review commonly prescribed medications for heart disease. Reviews the medication, class of the drug, and  side effects.   Know Your Numbers and Risk Factors: -Group verbal and written instruction about important numbers in your health.  Discussion of what are risk factors and how they play a role in the disease process.  Review of Cholesterol, Blood Pressure, Diabetes, and BMI and the role they play in your overall health.   Pulmonary Rehab from 11/13/2018 in [facility] Cardiac and Pulmonary Rehab  Date  11/06/18  Educator  [facility]  Instruction Review Code  1- Verbalizes Understanding      Sleep Hygiene: -Provides group verbal and written instruction about how sleep can affect your health.  Define sleep hygiene, discuss sleep cycles and impact of sleep habits. Review good sleep hygiene tips.    Pulmonary Rehab from 11/13/2018 in [facility] Cardiac and Pulmonary Rehab  Date  09/18/18  Educator  [facility]  Instruction Review Code  1- Verbalizes Understanding      Other: -Provides group and verbal instruction on various topics (see comments)    Knowledge Questionnaire Score: Knowledge Questionnaire Score - 08/26/18 1433      Knowledge Questionnaire Score   Pre Score  15/18   reviewed with patient       Core Components/Risk Factors/Patient Goals at Admission: Personal Goals and Risk Factors at Admission - 08/26/18 1458      Core Components/Risk Factors/Patient Goals on Admission    Weight Management  Yes;Weight Loss    Intervention  Weight Management: Develop a combined nutrition and exercise program designed to reach desired caloric intake, while maintaining appropriate intake of nutrient and fiber, sodium and fats, and appropriate energy expenditure required for the weight goal.;Weight Management: Provide education and appropriate resources to help participant work on and attain dietary goals.;Weight Management/Obesity: Establish reasonable short term and long term weight goals.    Admit Weight  183 lb 8 oz (83.2 kg)    Goal Weight: Short Term  178 lb (80.7 kg)    Goal Weight: Long Term  170 lb (77.1 kg)     Expected Outcomes  Short Term: Continue to assess and modify interventions until short term weight is achieved;Long Term: Adherence to nutrition and physical activity/exercise program aimed toward attainment of established weight goal;Weight Loss: Understanding of general recommendations for a balanced deficit meal plan, which promotes 1-2 lb weight loss per week and includes a negative energy balance of [id number] kcal/d;Understanding recommendations for meals to include 15-35% energy as protein, 25-35% energy from fat, 35-60% energy from carbohydrates, less than '200mg'$  of dietary cholesterol, 20-35 gm of total fiber daily;Understanding of distribution of calorie intake throughout the day with the consumption of 4-5 meals/snacks    Improve shortness of breath with ADL's  Yes    Intervention  Provide education, individualized exercise plan and daily activity instruction to help decrease symptoms of SOB with activities of daily living.    Expected Outcomes  Short Term: Improve cardiorespiratory fitness to achieve a reduction of symptoms when performing ADLs;Long Term: Be able to perform more ADLs without symptoms or delay the onset of symptoms       Core Components/Risk Factors/Patient Goals Review:  Goals and Risk Factor Review    Row Name 09/18/18 1146 10/16/18 1405 11/04/18 1102         Core Components/Risk Factors/Patient Goals Review   Personal Goals Review  Weight Management/Obesity;Improve shortness of breath with ADL's  Weight Management/Obesity;Improve shortness of breath with ADL's;Heart Failure  Weight Management/Obesity;Improve shortness  of breath with ADL's;Heart Failure     Review  Patient would like to lose a little bit of weight to help with his breathing. He is trying to attend class regularly since he has been out most of December.   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 He is wanting to get a lung transplant and his doctors state that he will have  to do rehab that their facility. Informed patient to get as much out of the program as he can before then.  Patient states that he is taking all medication as prescribed. The [facility] Transplant team wants him to maintain his current weight, which he is trying to do.      Expected Outcomes  Short: Attend [facility] regularly to improve shortness of breath with ADL's. Long: maintain independence with ADL's   Short: attend [facility] regularly. Long: Do the Lung Transplant program.  Short: attend [facility] regulartly and continue to take medications. Long: Do the Lung Transplant program at [facility].         Core Components/Risk Factors/Patient Goals at Discharge (Final Review):  Goals and Risk Factor Review - 11/04/18 1102      Core Components/Risk Factors/Patient Goals Review   Personal Goals Review  Weight Management/Obesity;Improve shortness of breath with ADL's;Heart Failure    Review  Patient states that he is taking all medication as prescribed. The [facility] Transplant team wants him to maintain his current weight, which he is trying to do.     Expected Outcomes  Short: attend [facility] regulartly and continue to take medications. Long: Do the Lung Transplant program at [facility].        ITP Comments: ITP Comments    Row Name 08/26/18 1425 10/21/18 0829 11/18/18 0816       ITP Comments  Medical Evaluation completed. Chart sent for review and changes to Dr. Emily Filbert Director of [place]. Diagnosis can be found in Care everywhere 08/15/18  30 day review completed. ITP sent to Dr. Emily Filbert Director of [place]. Continue with ITP unless changes are made by physician.  30 day review completed. ITP sent to Dr. Emily Filbert Director of [place]. Continue with ITP unless changes are made by physician.        Comments: 30 day review

## 2018-11-20 ENCOUNTER — Other Ambulatory Visit: Payer: Self-pay

## 2018-11-20 DIAGNOSIS — R0602 Shortness of breath: Secondary | ICD-10-CM | POA: Diagnosis not present

## 2018-11-20 DIAGNOSIS — J84112 Idiopathic pulmonary fibrosis: Secondary | ICD-10-CM

## 2018-11-20 NOTE — Progress Notes (Signed)
Daily Session Note  Patient Details  Name: Caleb Townsend MRN: [id number] Date of Birth: [birth year] Referring Provider:     Pulmonary Rehab from 08/26/2018 in [facility]  Referring Provider  Rosana Berger MD      Encounter Date: 11/20/2018  Check In: Session Check In - 11/20/18 0959      Check-In   Supervising physician immediately available to respond to emergencies  [facility] immediately available ER MD    Physician(s)  Drs. Glena Norfolk    Location  [facility]-Cardiac & Pulmonary Rehab    Staff Present  Alberteen Sam, MA, RCEP, CCRP, Exercise Physiologist;Joseph Foy [place], [place], ACSM CEP, Exercise Physiologist    Medication changes reported      No    Fall or balance concerns reported     No    Warm-up and Cool-down  Performed as group-led instruction    Resistance Training Performed  Yes    VAD Patient?  No    PAD/SET Patient?  No      Pain Assessment   Currently in Pain?  No/denies          Social History   Tobacco Use  Smoking Status Former Smoker  . Packs/day: 1.00  . Years: 30.00  . Pack years: 30.00  . Types: Cigarettes  . Last attempt to quit: 09/12/2007  . Years since quitting: 11.1  Smokeless Tobacco Former Systems developer  . Quit date: 09/11/2012    Goals Met:  Proper associated with RPD/PD & O2 Sat Independence with exercise equipment Using PLB without cueing & demonstrates good technique Exercise tolerated well Personal goals reviewed No report of cardiac concerns or symptoms Strength training completed today  Goals Unmet:  Not Applicable  Comments: Pt able to follow exercise prescription today without complaint.  Will continue to monitor for progression.    Dr. Emily Filbert is Medical Director for [facility] and [facility].

## 2018-11-22 ENCOUNTER — Other Ambulatory Visit: Payer: Self-pay

## 2018-11-22 DIAGNOSIS — R0602 Shortness of breath: Secondary | ICD-10-CM | POA: Diagnosis not present

## 2018-11-22 DIAGNOSIS — J84112 Idiopathic pulmonary fibrosis: Secondary | ICD-10-CM

## 2018-11-22 NOTE — Progress Notes (Signed)
Daily Session Note  Patient Details  Name: Caleb Townsend MRN: [id number] Date of Birth: 12/30/1950 Referring Provider:     Pulmonary Rehab from 08/26/2018 in [facility] Card[facility] and Pul[facility] Rehab  Referring Provider  Swaminathan, Aparna MD      Encounter Date: 11/22/2018  Check In: Session Check In - 11/22/18 0955      Check-In   Supervising physician immediately available to respond to emergencies  [facility] immediately available ER MD    Physician(s)  Dr. Kinner and McShane    Location  [facility] Pul[facility]    Staff Present  Joseph Hood RCP,RRT,BSRT;Krista Spencer, RN BSN;Amanda Sommer, BA, ACSM CEP, Exercise Physiologist    Medication changes reported      No    Fall or balance concerns reported     No    Warm-up and Cool-down  Performed as group-led instruction    Resistance Training Performed  Yes    VAD Patient?  No    PAD/SET Patient?  No      Pain Assessment   Currently in Pain?  No/denies          Social History   Tobacco Use  Smoking Status Former Smoker  . Packs/day: 1.00  . Years: 30.00  . Pack years: 30.00  . Types: Cigarettes  . Last attempt to quit: 09/12/2007  . Years since quitting: 11.2  Smokeless Tobacco Former User  . Quit date: 09/11/2012    Goals Met:  Independence with exercise equipment Exercise tolerated well No report of cardiac concerns or symptoms Strength training completed today  Goals Unmet:  Not Applicable  Comments: Pt able to follow exercise prescription today without complaint.  Will continue to monitor for progression.    Dr. Mark Miller is Medical Director for [facility] and [facility]. 

## 2018-11-26 ENCOUNTER — Other Ambulatory Visit: Payer: Self-pay

## 2018-11-26 VITALS — Ht 71.25 in | Wt 176.9 lb

## 2018-11-26 DIAGNOSIS — J84112 Idiopathic pulmonary fibrosis: Secondary | ICD-10-CM

## 2018-11-26 DIAGNOSIS — R0602 Shortness of breath: Secondary | ICD-10-CM | POA: Diagnosis not present

## 2018-11-26 NOTE — Progress Notes (Signed)
Pulmonary Individual Treatment Plan  Patient Details  Name: Caleb Townsend MRN: [id number] Date of Birth: [birth year] Referring Provider:     Pulmonary Rehab from 08/26/2018 in [facility] Pul[facility] Rehab  Referring Provider  Rosana Berger MD      Initial Encounter Date:    Pulmonary Rehab from 08/26/2018 in [facility] Cardiac and Pulmonary Rehab  Date  08/26/18      Visit Diagnosis: IPF (idiopathic pulmonary fibrosis) ([place])  Patient's Home Medications on Admission: No current outpatient medications on file.  Past Medical History: No past medical history on file.  Tobacco Use: Social History   Tobacco Use  Smoking Status Former Smoker  . Packs/day: 1.00  . Years: 30.00  . Pack years: 30.00  . Types: Cigarettes  . Last attempt to quit: 09/12/2007  . Years since quitting: 11.2  Smokeless Tobacco Former Systems developer  . Quit date: 09/11/2012    Labs: Recent Review Flowsheet Data    There is no flowsheet data to display.       Pulmonary Assessment Scores: Pulmonary Assessment Scores    Row Name 08/26/18 1431 11/20/18 1028 11/26/18 1030     ADL UCSD   ADL Phase  Entry  Exit  Exit   SOB Score total  51  86  86   Rest  0  1  1   Walk  '2  3  3   ' Stairs  '4  5  5   ' Bath  '3  4  4   ' Dress  '2  4  4   ' Shop  '1  4  4     ' CAT Score   CAT Score  '24  26  26     ' mMRC Score   mMRC Score  2  -  2      Pulmonary Function Assessment: Pulmonary Function Assessment - 08/26/18 1452      Breath   Bilateral Breath Sounds  Clear;Decreased    Shortness of Breath  Yes;Limiting activity       Exercise Target Goals: Exercise Program Goal: Individual exercise prescription set using results from initial 6 min walk test and THRR while considering  patient's activity barriers and safety.   Exercise Prescription Goal: Initial exercise prescription builds to 30-45 minutes a day of aerobic activity, 2-3 days per week.  Home exercise guidelines will be given to patient during  program as part of exercise prescription that the participant will acknowledge.  Activity Barriers & Risk Stratification: Activity Barriers & Cardiac Risk Stratification - 08/26/18 1459      Activity Barriers & Cardiac Risk Stratification   Activity Barriers  Deconditioning;Shortness of Breath;Back Problems   previous back surgery x2      6 Minute Walk: 6 Minute Walk    Row Name 08/26/18 1458 11/26/18 1027       6 Minute Walk   Phase  Initial  Discharge    Distance  1410 feet  1650 feet    Distance % Change  -  17 %    Distance Feet Change  -  240 ft    Walk Time  6 minutes  6 minutes    # of Rest Breaks  0  0    MPH  2.67  3.13    METS  4.06  4.35    RPE  12  11    Perceived Dyspnea   2  3    VO2 Peak  14.22  15.23    Symptoms  Yes (comment)  -    Comments  SOB  SOB    Resting HR  107 bpm  109 bpm    Resting BP  126/70  114/74    Resting Oxygen Saturation   95 %  95 %    Exercise Oxygen Saturation  during 6 min walk  87 %  83 %    Max Ex. HR  134 bpm  126 bpm    Max Ex. BP  164/84  154/72    2 Minute Post BP  126/72  132/62      Interval HR   1 Minute HR  110  118    2 Minute HR  109  126    3 Minute HR  109  126    4 Minute HR  109  -    5 Minute HR  109  -    6 Minute HR  134  124    2 Minute Post HR  115  115    Interval Heart Rate?  Yes  Yes      Interval Oxygen   Interval Oxygen?  Yes  Yes    Baseline Oxygen Saturation %  95 %  95 %    1 Minute Oxygen Saturation %  92 %  95 %    1 Minute Liters of Oxygen  0 L Room Air  5 L pulsed    2 Minute Oxygen Saturation %  88 %  90 %    2 Minute Liters of Oxygen  0 L  5 L    3 Minute Oxygen Saturation %  88 %  85 %    3 Minute Liters of Oxygen  0 L  5 L    4 Minute Oxygen Saturation %  87 %  85 %    4 Minute Liters of Oxygen  0 L  5 L    5 Minute Oxygen Saturation %  88 %  83 %    5 Minute Liters of Oxygen  0 L  5 L    6 Minute Oxygen Saturation %  87 %  83 %    6 Minute Liters of Oxygen  0 L  5 L    2 Minute  Post Oxygen Saturation %  95 %  96 %    2 Minute Post Liters of Oxygen  0 L  5 L      Oxygen Initial Assessment: Oxygen Initial Assessment - 08/26/18 1452      Home Oxygen   Home Oxygen Device  None    Sleep Oxygen Prescription  None    Home Exercise Oxygen Prescription  None    Home at Rest Exercise Oxygen Prescription  None      Initial 6 min Walk   Oxygen Used  None      Program Oxygen Prescription   Program Oxygen Prescription  None      Intervention   Short Term Goals  To learn and demonstrate proper use of respiratory medications;To learn and demonstrate proper pursed lip breathing techniques or other breathing techniques.;To learn and understand importance of maintaining oxygen saturations>88%;To learn and understand importance of monitoring SPO2 with pulse oximeter and demonstrate accurate use of the pulse oximeter.    Long  Term Goals  Maintenance of O2 saturations>88%;Verbalizes importance of monitoring SPO2 with pulse oximeter and return demonstration;Exhibits proper breathing techniques, such as pursed lip breathing or other method taught during program session;Compliance with respiratory medication;Demonstrates proper use of  MDI's       Oxygen Re-Evaluation: Oxygen Re-Evaluation    Row Name 09/16/18 1025 09/27/18 1044 11/04/18 1104 11/20/18 1404       Program Oxygen Prescription   Program Oxygen Prescription  None  None  Continuous  Continuous    Liters per minute  -  -  3  3      Home Oxygen   Home Oxygen Device  None  Home Concentrator uses PRN when he has exerted himself  Home Concentrator  Home Concentrator    Sleep Oxygen Prescription  None  None  None  None    Home Exercise Oxygen Prescription  None  None  None  Continuous    Liters per minute  -  -  -  3    Home at Rest Exercise Oxygen Prescription  None  None  None  None    Compliance with Home Oxygen Use  -  Yes  Yes  Yes      Goals/Expected Outcomes   Short Term Goals  To learn and demonstrate proper  use of respiratory medications;To learn and demonstrate proper pursed lip breathing techniques or other breathing techniques.;To learn and understand importance of maintaining oxygen saturations>88%;To learn and understand importance of monitoring SPO2 with pulse oximeter and demonstrate accurate use of the pulse oximeter.  To learn and demonstrate proper use of respiratory medications;To learn and demonstrate proper pursed lip breathing techniques or other breathing techniques.;To learn and understand importance of maintaining oxygen saturations>88%;To learn and understand importance of monitoring SPO2 with pulse oximeter and demonstrate accurate use of the pulse oximeter.  To learn and demonstrate proper use of respiratory medications;To learn and demonstrate proper pursed lip breathing techniques or other breathing techniques.;To learn and understand importance of maintaining oxygen saturations>88%;To learn and understand importance of monitoring SPO2 with pulse oximeter and demonstrate accurate use of the pulse oximeter.  To learn and demonstrate proper use of respiratory medications;To learn and demonstrate proper pursed lip breathing techniques or other breathing techniques.;To learn and understand importance of maintaining oxygen saturations>88%;To learn and understand importance of monitoring SPO2 with pulse oximeter and demonstrate accurate use of the pulse oximeter.    Long  Term Goals  Maintenance of O2 saturations>88%;Verbalizes importance of monitoring SPO2 with pulse oximeter and return demonstration;Exhibits proper breathing techniques, such as pursed lip breathing or other method taught during program session;Compliance with respiratory medication;Demonstrates proper use of MDI's  Maintenance of O2 saturations>88%;Verbalizes importance of monitoring SPO2 with pulse oximeter and return demonstration;Exhibits proper breathing techniques, such as pursed lip breathing or other method taught during  program session;Compliance with respiratory medication;Demonstrates proper use of MDI's  Maintenance of O2 saturations>88%;Verbalizes importance of monitoring SPO2 with pulse oximeter and return demonstration;Exhibits proper breathing techniques, such as pursed lip breathing or other method taught during program session;Compliance with respiratory medication;Demonstrates proper use of MDI's  Maintenance of O2 saturations>88%;Verbalizes importance of monitoring SPO2 with pulse oximeter and return demonstration;Exhibits proper breathing techniques, such as pursed lip breathing or other method taught during program session;Compliance with respiratory medication;Demonstrates proper use of MDI's    Comments  Reviewed PLB technique with pt.  Talked about how it work and it's important to maintaining his exercise saturations.    Patient has been using PLB and he states that it has been helping him. His doctor ordered him a portable unit and is in the process of getting one. He has a concentrator at home but does not use it when he is at rest.  Patient did get his portable unit for home and uses it any time he is doing any activity. He is currently using about 4 liters at home. He is using PLB but does feel like his SOB is continuing to get worse.   Informed patient about how to use, when to use and gave direction on how to improve their respiratory function. Patient performed about 10 repetitions with the device with no respiratory distress. Patient was sent home with the device and instructions. Patient verbalizes understanding of the device.    Goals/Expected Outcomes  Short: Become more profiecient at using PLB.   Long: Become independent at using PLB.  Short: obtain a portable oxygen concentrator. Long: use portable oxygen concentrator as prescribed.  Short: use portable oxygen at home as needed. Long: [facility] program to obtain a lung transplant.   Short: use inspiratory trainer daily to improve lung  function. Long: maintain use of inspiratory trainer independently.       Oxygen Discharge (Final Oxygen Re-Evaluation): Oxygen Re-Evaluation - 11/20/18 1404      Program Oxygen Prescription   Program Oxygen Prescription  Continuous    Liters per minute  3      Home Oxygen   Home Oxygen Device  Home Concentrator    Sleep Oxygen Prescription  None    Home Exercise Oxygen Prescription  Continuous    Liters per minute  3    Home at Rest Exercise Oxygen Prescription  None    Compliance with Home Oxygen Use  Yes      Goals/Expected Outcomes   Short Term Goals  To learn and demonstrate proper use of respiratory medications;To learn and demonstrate proper pursed lip breathing techniques or other breathing techniques.;To learn and understand importance of maintaining oxygen saturations>88%;To learn and understand importance of monitoring SPO2 with pulse oximeter and demonstrate accurate use of the pulse oximeter.    Long  Term Goals  Maintenance of O2 saturations>88%;Verbalizes importance of monitoring SPO2 with pulse oximeter and return demonstration;Exhibits proper breathing techniques, such as pursed lip breathing or other method taught during program session;Compliance with respiratory medication;Demonstrates proper use of MDI's    Comments  Informed patient about how to use, when to use and gave direction on how to improve their respiratory function. Patient performed about 10 repetitions with the device with no respiratory distress. Patient was sent home with the device and instructions. Patient verbalizes understanding of the device.    Goals/Expected Outcomes  Short: use inspiratory trainer daily to improve lung function. Long: maintain use of inspiratory trainer independently.       Initial Exercise Prescription: Initial Exercise Prescription - 08/26/18 1500      Date of Initial Exercise RX and Referring Provider   Date  08/26/18    Referring Provider  Rosana Berger MD       Treadmill   MPH  2.5    Grade  1    Minutes  15    METs  3.26      Recumbant Elliptical   Level  2    RPM  50    Minutes  15    METs  3      T5 Nustep   Level  3    SPM  80    Minutes  15    METs  3.2      Prescription Details   Frequency (times per week)  3    Duration  Progress to 45 minutes of aerobic exercise without  signs/symptoms of physical distress      Intensity   THRR 40-80% of Max Heartrate  128/143    Ratings of Perceived Exertion  11-13    Perceived Dyspnea  0-4      Progression   Progression  Continue to progress workloads to maintain intensity without signs/symptoms of physical distress.      Resistance Training   Training Prescription  Yes    Weight  4 lbs    Reps  10-15       Perform Capillary Blood Glucose checks as needed.  Exercise Prescription Changes: Exercise Prescription Changes    Row Name 08/26/18 1500 09/25/18 1500 10/10/18 0900 10/22/18 1000 11/06/18 1300     Response to Exercise   Blood Pressure (Admit)  126/70  122/78  118/66  104/62  124/72   Blood Pressure (Exercise)  164/84  140/64  -  -  -   Blood Pressure (Exit)  126/72  122/60  110/68  112/62  100/60   Heart Rate (Admit)  107 bpm  66 bpm  102 bpm  104 bpm  94 bpm   Heart Rate (Exercise)  134 bpm  128 bpm  123 bpm  122 bpm  113 bpm   Heart Rate (Exit)  115 bpm  111 bpm  111 bpm  121 bpm  100 bpm   Oxygen Saturation (Admit)  95 %  97 %  93 %  93 %  96 %   Oxygen Saturation (Exercise)  87 %  87 %  86 %  89 %  86 %   Oxygen Saturation (Exit)  95 %  92 %  91 %  91 %  88 %   Rating of Perceived Exertion (Exercise)  '12  13  13  12  12   ' Perceived Dyspnea (Exercise)  '2  2  3  2  3   ' Symptoms  SOB  none  none  none  none   Comments  walk test results  4 th session  -  -  -   Duration  -  Continue with 45 min of aerobic exercise without signs/symptoms of physical distress.  Continue with 45 min of aerobic exercise without signs/symptoms of physical distress.  Continue with 45 min of  aerobic exercise without signs/symptoms of physical distress.  Continue with 45 min of aerobic exercise without signs/symptoms of physical distress.   Intensity  -  THRR unchanged  THRR unchanged  THRR unchanged  THRR unchanged     Progression   Progression  -  Continue to progress workloads to maintain intensity without signs/symptoms of physical distress.  Continue to progress workloads to maintain intensity without signs/symptoms of physical distress.  Continue to progress workloads to maintain intensity without signs/symptoms of physical distress.  Continue to progress workloads to maintain intensity without signs/symptoms of physical distress.   Average METs  -  2.5  2.6  2.4  2.6     Resistance Training   Training Prescription  -  Yes  Yes  Yes  Yes   Weight  -  4 lb  4 lb  4 lb  4 lb   Reps  -  10-15  10-15  10-15  10-15     Interval Training   Interval Training  -  No  No  No  -     Oxygen   Oxygen  -  -  -  Continuous  Continuous   Liters  -  -  -  '3  3 5 ' on TM     Treadmill   MPH  -  2.5  2.5  2.5  2.5   Grade  -  '1  1  1  1   ' Minutes  -  '15  15  15  15   ' METs  -  3.26  3.26  3.26  3.26     Recumbant Elliptical   Level  -  '3  3  3  3   ' RPM  -  50  50  50  50   Minutes  -  '15  15  15  15   ' METs  -  2.2  2.3  1.8  2.2     T5 Nustep   Level  -  '3  3  3  3   ' SPM  -  80  80  80  80   Minutes  -  '15  15  15  15   ' METs  -  2  2  2.1  2   Row Name 11/19/18 1200             Response to Exercise   Blood Pressure (Admit)  110/72       Blood Pressure (Exercise)  128/70       Blood Pressure (Exit)  110/60       Heart Rate (Admit)  105 bpm       Heart Rate (Exercise)  117 bpm       Heart Rate (Exit)  99 bpm       Oxygen Saturation (Admit)  94 %       Oxygen Saturation (Exercise)  90 %       Oxygen Saturation (Exit)  92 %       Rating of Perceived Exertion (Exercise)  12       Perceived Dyspnea (Exercise)  2       Symptoms  none       Duration  Continue with 45 min  of aerobic exercise without signs/symptoms of physical distress.       Intensity  THRR unchanged         Progression   Progression  Continue to progress workloads to maintain intensity without signs/symptoms of physical distress.       Average METs  2.5         Resistance Training   Training Prescription  Yes       Weight  4 lb       Reps  10-15         Oxygen   Oxygen  Continuous       Liters  3         Treadmill   MPH  2.5       Grade  1       Minutes  15       METs  3.26         Recumbant Elliptical   Level  3       RPM  50       Minutes  15       METs  2.2         T5 Nustep   Level  4       SPM  80       Minutes  15       METs  2.1          Exercise Comments: Exercise Comments    Row  Name 09/16/18 1024 11/26/18 1031         Exercise Comments   First full day of exercise!  Patient was oriented to gym and equipment including functions, settings, policies, and procedures.  Patient's individual exercise prescription and treatment plan were reviewed.  All starting workloads were established based on the results of the 6 minute walk test done at initial orientation visit.  The plan for exercise progression was also introduced and progression will be customized based on patient's performance and goals.   Arlen graduated today from  rehab with 27 sessions completed.  Details of the patient's exercise prescription and what He needs to do in order to continue the prescription and progress were discussed with patient.  Patient was given a copy of prescription and goals.  Patient verbalized understanding.  Daymion plans to continue to exercise by going to [facility] rehab for a lung Transplant.         Exercise Goals and Review: Exercise Goals    Row Name 08/26/18 1502             Exercise Goals   Increase Physical Activity  Yes       Intervention  Provide advice, education, support and counseling about physical activity/exercise needs.;Develop an individualized exercise  prescription for aerobic and resistive training based on initial evaluation findings, risk stratification, comorbidities and participant's personal goals.       Expected Outcomes  Short Term: Attend rehab on a regular basis to increase amount of physical activity.;Long Term: Add in home exercise to make exercise part of routine and to increase amount of physical activity.;Long Term: Exercising regularly at least 3-5 days a week.       Increase Strength and Stamina  Yes       Intervention  Provide advice, education, support and counseling about physical activity/exercise needs.;Develop an individualized exercise prescription for aerobic and resistive training based on initial evaluation findings, risk stratification, comorbidities and participant's personal goals.       Expected Outcomes  Short Term: Increase workloads from initial exercise prescription for resistance, speed, and METs.;Short Term: Perform resistance training exercises routinely during rehab and add in resistance training at home;Long Term: Improve cardiorespiratory fitness, muscular endurance and strength as measured by increased METs and functional capacity (6MWT)       Able to understand and use rate of perceived exertion (RPE) scale  Yes       Intervention  Provide education and explanation on how to use RPE scale       Expected Outcomes  Short Term: Able to use RPE daily in rehab to express subjective intensity level;Long Term:  Able to use RPE to guide intensity level when exercising independently       Knowledge and understanding of Target Heart Rate Range (THRR)  Yes       Intervention  Provide education and explanation of THRR including how the numbers were predicted and where they are located for reference       Expected Outcomes  Short Term: Able to state/look up THRR;Long Term: Able to use THRR to govern intensity when exercising independently;Short Term: Able to use daily as guideline for intensity in rehab       Able to check  pulse independently  Yes       Intervention  Provide education and demonstration on how to check pulse in carotid and radial arteries.;Review the importance of being able to check your own pulse for safety during independent exercise  Expected Outcomes  Short Term: Able to explain why pulse checking is important during independent exercise;Long Term: Able to check pulse independently and accurately       Understanding of Exercise Prescription  Yes       Intervention  Provide education, explanation, and written materials on patient's individual exercise prescription       Expected Outcomes  Short Term: Able to explain program exercise prescription;Long Term: Able to explain home exercise prescription to exercise independently          Exercise Goals Re-Evaluation : Exercise Goals Re-Evaluation    Row Name 09/16/18 1024 09/25/18 1542 09/27/18 1032 10/10/18 0947 10/22/18 1012     Exercise Goal Re-Evaluation   Exercise Goals Review  Increase Physical Activity;Increase Strength and Stamina;Able to understand and use rate of perceived exertion (RPE) scale;Knowledge and understanding of Target Heart Rate Range (THRR);Understanding of Exercise Prescription;Able to understand and use Dyspnea scale  Increase Physical Activity;Increase Strength and Stamina;Able to understand and use rate of perceived exertion (RPE) scale;Able to understand and use Dyspnea scale;Knowledge and understanding of Target Heart Rate Range (THRR);Understanding of Exercise Prescription  Increase Physical Activity;Able to understand and use rate of perceived exertion (RPE) scale;Knowledge and understanding of Target Heart Rate Range (THRR);Understanding of Exercise Prescription;Increase Strength and Stamina;Able to understand and use Dyspnea scale;Able to check pulse independently  Increase Physical Activity;Increase Strength and Stamina;Able to understand and use rate of perceived exertion (RPE) scale;Able to understand and use  Dyspnea scale;Understanding of Exercise Prescription  Increase Physical Activity;Increase Strength and Stamina;Able to understand and use rate of perceived exertion (RPE) scale;Able to understand and use Dyspnea scale;Knowledge and understanding of Target Heart Rate Range (THRR);Able to check pulse independently;Understanding of Exercise Prescription   Comments  Reviewed RPE scale, THR and program prescription with pt today.  Pt voiced understanding and was given a copy of goals to take home.   Caleb Townsend has tolerated exercise well.  He needs supplemental oxygen on the TM only.  Staff will monitor progress.   Reviewed home exercise with pt today.  Pt plans to go to [facility] for exercise.  Reviewed THR, pulse, RPE, sign and symptoms, NTG use, and when to call 911 or MD.  Also discussed weather considerations and indoor options.  Pt voiced understanding.  Caleb Townsend continues to tolerate exercise well.  He has increased 02 on TM to 3 L to maintain 02 above 88%.    Caleb Townsend  tolerates exercise well.  He has been through preliminary testing at [facility] for lung transplant.  Staff will monitor progress.   Expected Outcomes  Short: Use RPE daily to regulate intensity. Long: Follow program prescription in THR.  Short - attend classs consistently Long - increase MET level  Short - check oxygen when exercising outside program classes one day per week Long - maintain exercise independently  Short - increase levels on seated machines Long - increase overall MET level  Short - continue to attend consistently Long - increase MET level   Row Name 11/04/18 1106 11/06/18 1307 11/19/18 1254         Exercise Goal Re-Evaluation   Exercise Goals Review  Increase Physical Activity;Increase Strength and Stamina;Able to understand and use rate of perceived exertion (RPE) scale;Knowledge and understanding of Target Heart Rate Range (THRR);Understanding of Exercise Prescription;Able to understand and use Dyspnea scale  Increase Physical  Activity;Increase Strength and Stamina;Able to understand and use rate of perceived exertion (RPE) scale;Able to understand and use Dyspnea scale;Knowledge and understanding of  Target Heart Rate Range (THRR);Understanding of Exercise Prescription  Increase Physical Activity;Increase Strength and Stamina;Able to understand and use rate of perceived exertion (RPE) scale;Able to understand and use Dyspnea scale;Knowledge and understanding of Target Heart Rate Range (THRR);Able to check pulse independently;Understanding of Exercise Prescription     Comments  Caleb Townsend does tolerate  exercise during class well, but states that his SOB continues to be his major limiting factor. He stated that he feels like physically he could push himself more if the SOB was not so bad. He will go for his 5 day evaluation with the [facility] transplant team starting March 19th, and if all goes well will start with [facility] rehab until lungs become available. Caleb Townsend attends consistently and works as much as he is able.  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  [place] contiues to tolerate exercise well.  He has eval at [facility] soon for lung transplant.     Expected Outcomes  Short: continue to attend [facility] regularly. 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   Short - continue to attend consistently Long - meet exercise goals for transplant   Short - complete LW program Long - maintain exercise on his own        Discharge Exercise Prescription (Final Exercise Prescription Changes): Exercise Prescription Changes - 11/19/18 1200      Response to Exercise   Blood Pressure (Admit)  110/72    Blood Pressure (Exercise)  128/70    Blood Pressure (Exit)  110/60    Heart Rate (Admit)  105 bpm    Heart Rate (Exercise)  117 bpm    Heart Rate (Exit)  99 bpm    Oxygen Saturation (Admit)  94 %    Oxygen Saturation (Exercise)  90 %    Oxygen Saturation (Exit)  92 %     Rating of Perceived Exertion (Exercise)  12    Perceived Dyspnea (Exercise)  2    Symptoms  none    Duration  Continue with 45 min of aerobic exercise without signs/symptoms of physical distress.    Intensity  THRR unchanged      Progression   Progression  Continue to progress workloads to maintain intensity without signs/symptoms of physical distress.    Average METs  2.5      Resistance Training   Training Prescription  Yes    Weight  4 lb    Reps  10-15      Oxygen   Oxygen  Continuous    Liters  3      Treadmill   MPH  2.5    Grade  1    Minutes  15    METs  3.26      Recumbant Elliptical   Level  3    RPM  50    Minutes  15    METs  2.2      T5 Nustep   Level  4    SPM  80    Minutes  15    METs  2.1       Nutrition:  Target Goals: Understanding of nutrition guidelines, daily intake of sodium <1539m, cholesterol <20108m calories 30% from fat and 7% or less from saturated fats, daily to have 5 or more servings of fruits and vegetables.  Biometrics: Pre Biometrics - 08/26/18 1502      Pre Biometrics   Height  5' 11.25" (1.81 m)  Weight  183 lb 8 oz (83.2 kg)    Waist Circumference  34 inches    Hip Circumference  39 inches    Waist to Hip Ratio  0.87 %    BMI (Calculated)  25.41      Post Biometrics - 11/26/18 1029       Post  Biometrics   Height  5' 11.25" (1.81 m)    Weight  176 lb 14.4 oz (80.2 kg)    Waist Circumference  34.5 inches    Hip Circumference  39 inches    Waist to Hip Ratio  0.88 %    BMI (Calculated)  24.49       Nutrition Therapy Plan and Nutrition Goals: Nutrition Therapy & Goals - 09/18/18 1357      Nutrition Therapy   Diet  TLC    Protein (specify units)  8oz    Fiber  35 grams    Whole Grain Foods  3 servings   does not typically choose whole grains   Saturated Fats  14 max. grams    Fruits and Vegetables  6 servings/day   8 ideal, tries to eat fruits/ vegetables daily   Sodium  1500 grams      Personal  Nutrition Goals   Nutrition Goal  Continue to work on decreasing the amount of sugar added to beverages like sweet tea, great job for getting started with this!    Personal Goal #2  Select lean cuts / options for red meats since you eat them more often than most other protein sources    Comments  States he tries to eat a diet that is lower in fat and cholesterol. He has been using honey to sweeten things like coffee rather than table sugar and has been eating breakfast consistently d/t taking a new medication. Breakfast: cereal with 2% milk, yogurt, sometimes eggs / peanut butter. Lunch: (out 2-3x/wk) burger, [employer] sub, [place] sandwich on white bread, leftovers. Dinner: (out 1-2x/wk to [employer]) boston butt, squash, corn, steak, baked potato, salad, fish (baked or fried occasionally), pork, shrimp, some chicken but does not prefer it, spaghetti with ground beef. Snacks: fruit, ice cream occasionally. Beverages: water, sweet tea, used to drink beer when golfing but has been unable to golf as of late. His wife does not cook with salt but he does add salt at the table      Intervention Plan   Intervention  Prescribe, educate and counsel regarding individualized specific dietary modifications aiming towards targeted core components such as weight, hypertension, lipid management, diabetes, heart failure and other comorbidities.    Expected Outcomes  Short Term Goal: Understand basic principles of dietary content, such as calories, fat, sodium, cholesterol and nutrients.;Short Term Goal: A plan has been developed with personal nutrition goals set during dietitian appointment.;Long Term Goal: Adherence to prescribed nutrition plan.       Nutrition Assessments: Nutrition Assessments - 11/20/18 1029      MEDFICTS Scores   Pre Score  43    Post Score  32    Score Difference  -11       Nutrition Goals Re-Evaluation: Nutrition Goals Re-Evaluation    [place] Name 09/18/18 1144 09/18/18 1427 10/16/18 1407  11/04/18 1058       Goals   Nutrition Goal  Lose a little weight  Select lean cuts/ options for red meats since you eat them more often than most other protein sources  Get blood sugar levels lower and improve LDL readings.  -  Comment  Patient would like to meet with the dietician. He wants to lose a little bit of weight. His diet is important since he has breathing issues and may be wise to find out if he is getting enough nutrition  He prefers red meats over chicken/ turkey/ seafood and eats them multiple times per week  Patient states that his LDL slightly elevated and so was his blood sugar. He is trying to drink more water. With his LDL being a little high he is eating healthier fats like avocado.  Caleb Townsend stated that he is not drinking any soft drinks and has switched to 1/2 and 1/2 tea instead of sweet tea and is trying to drink more water. Caleb Townsend also stated that he has been eating less candy and sweets.     Expected Outcome  Short: meet with dietician. Long: adhere to a diet plan.  He will choose lean meats overall, especially for red meats   Short: work on eating lower cholesterol foods. Long: improve LDL value.  Short: continue to try to drink  more water and work on lower cholesterol foods. Long: improve LDL and maintain weight.      Personal Goal #2 Re-Evaluation   Personal Goal #2  -  Continue to work on decreasing the amount of sugar added to beverages like sweet tea, great job for getting started with this!  -  -       Nutrition Goals Discharge (Final Nutrition Goals Re-Evaluation): Nutrition Goals Re-Evaluation - 11/04/18 [place] stated that he is not drinking any soft drinks and has switched to 1/2 and 1/2 tea instead of sweet tea and is trying to drink more water. Caleb Townsend also stated that he has been eating less candy and sweets.     Expected Outcome  Short: continue to try to drink  more water and work on lower cholesterol foods. Long: improve LDL and  maintain weight.       Psychosocial: Target Goals: Acknowledge presence or absence of significant depression and/or stress, maximize coping skills, provide positive support system. Participant is able to verbalize types and ability to use techniques and skills needed for reducing stress and depression.   Initial Review & Psychosocial Screening: Initial Psych Review & Screening - 08/26/18 1453      Initial Review   Current issues with  None Identified      Family Dynamics   Good Support System?  Yes    Comments  He can look to his wife,2 daughters for support and close friends.      Barriers   Psychosocial barriers to participate in program  There are no identifiable barriers or psychosocial needs.;The patient should benefit from training in stress management and relaxation.      Screening Interventions   Interventions  Encouraged to exercise;Program counselor consult;To provide support and resources with identified psychosocial needs;Provide feedback about the scores to participant    Expected Outcomes  Short Term goal: Utilizing psychosocial counselor, staff and physician to assist with identification of specific Stressors or current issues interfering with healing process. Setting desired goal for each stressor or current issue identified.;Long Term Goal: Stressors or current issues are controlled or eliminated.;Short Term goal: Identification and review with participant of any Quality of Life or Depression concerns found by scoring the questionnaire.;Long Term goal: The participant improves quality of Life and PHQ9 Scores as seen by post scores and/or verbalization of changes  Quality of Life Scores:  Scores of 19 and below usually indicate a poorer quality of life in these areas.  A difference of  2-3 points is a clinically meaningful difference.  A difference of 2-3 points in the total score of the Quality of Life Index has been associated with significant improvement in overall  quality of life, self-image, physical symptoms, and general health in studies assessing change in quality of life.  PHQ-9: Recent Review Flowsheet Data    Depression screen [facility] 2/9 11/20/2018 08/26/2018   Decreased Interest 1 1   Down, Depressed, Hopeless 0 0   PHQ - 2 Score 1 1   Altered sleeping 0 1   Tired, decreased energy 2 2   Change in appetite 1 0   Feeling bad or failure about yourself  0 0   Trouble concentrating 1 0   Moving slowly or fidgety/restless 0 0   Suicidal thoughts 0 0   PHQ-9 Score 5 4   Difficult doing work/chores Somewhat difficult Somewhat difficult     Interpretation of Total Score  Total Score Depression Severity:  1-4 = Minimal depression, 5-9 = Mild depression, 10-14 = Moderate depression, 15-19 = Moderately severe depression, 20-27 = Severe depression   Psychosocial Evaluation and Intervention: Psychosocial Evaluation - 09/16/18 1037      Psychosocial Evaluation & Interventions   Interventions  Stress management education;Encouraged to exercise with the program and follow exercise prescription    Comments  Counselor met with Mr. G[facility]) today for initial psychsocial evaluation.  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  Caleb Townsend has a strong support system with a spouse of 24 years; (2) adult daughters in neighboring communities; good friends and active involvement in his local church.  He has a history of prostate cancer in 2013 and has remained cancer free since that time.  Caleb Townsend sleeps well and has a good appetite.  He denies a history of depression or anxiety and is typically in a positive mood.  His health is his primary stressor at this time.  He has goals to improve his breathing so that he can possibly be a transplant recipient.  Caleb Townsend meets with a transplant team on 2/3 & 2/4 to be evaluated for this.  He will be followed by staff here.    Expected Outcomes  Short:  Caleb Townsend will exercise according to his plan to improve his breathing  and increase his stamina in order to be a possible transplant recipient.  Long:  Caleb Townsend will continue to make positive self-care choices and maintain a positive attitude as he moves forward towards the transplant evaluation.      Continue Psychosocial Services   Follow up required by staff       Psychosocial Re-Evaluation: Psychosocial Re-Evaluation    [place] Name 10/02/18 1119 11/04/18 1051           Psychosocial Re-Evaluation   Current issues with  Current Stress Concerns  Current Stress Concerns      Comments  Counselor followed with Caleb Townsend today reporting his breathing has "gotten worse" with lower oxygen levels.  He has not noticed any progress in his strength either but was proud to have lost a few pounds and eating healthier - cutting out snacks.  Caleb Townsend continues to have a positive attitude and relies on his family and faith for stress in his life.  He will be meeting with the lung transplant team early February and is hoping for new lungs  soon!  Counselor commended Caleb Townsend on his progress made and his commitment to this process and his health.  Staff will continue to follow.  Patient stated that he is doing well emotionally, reporting no depression, but just a little frustration that he is not able to do they things he wants to do. He did state that he feels like his SOB is getting worse but hoping that if all goes through with his transplant that he will once again be able to do more in his daily life. He states he feels he has a strong support system with his family and his faith that is helping him though this.        Expected Outcomes  Short:  Caleb Townsend will continue to exercise consistently for his stamina and strength.  Long:  Caleb Townsend will hopefully get "new lungs" in the next month and be able to return to exercising for follow up/rehabilitation.   Short: Caleb Townsend will have his 5 day physical with the [facility] Transplant Team starting on March 19. Long: Caleb Townsend will be eligable and be able to have a lung  transplant.       Continue Psychosocial Services   Follow up required by staff  Follow up required by staff         Psychosocial Discharge (Final Psychosocial Re-Evaluation): Psychosocial Re-Evaluation - 11/04/18 1051      Psychosocial Re-Evaluation   Current issues with  Current Stress Concerns    Comments  Patient stated that he is doing well emotionally, reporting no depression, but just a little frustration that he is not able to do they things he wants to do. He did state that he feels like his SOB is getting worse but hoping that if all goes through with his transplant that he will once again be able to do more in his daily life. He states he feels he has a strong support system with his family and his faith that is helping him though this.      Expected Outcomes  Short: Caleb Townsend will have his 5 day physical with the [facility] Transplant Team starting on March 19. Long: Caleb Townsend will be eligable and be able to have a lung transplant.     Continue Psychosocial Services   Follow up required by staff       Education: Education Goals: Education classes will be provided on a weekly basis, covering required topics. Participant will state understanding/return demonstration of topics presented.  Learning Barriers/Preferences: Learning Barriers/Preferences - 08/26/18 1456      Learning Barriers/Preferences   Learning Barriers  None    Learning Preferences  None       Education Topics:  Initial Evaluation Education: - Verbal, written and demonstration of respiratory meds, oximetry and breathing techniques. Instruction on use of nebulizers and MDIs and importance of monitoring MDI activations.   Pulmonary Rehab from 11/20/2018 in [facility] Cardiac and Pulmonary Rehab  Date  08/26/18  Educator  [facility]  Instruction Review Code  1- Verbalizes Understanding      General Nutrition Guidelines/Fats and Fiber: -Group instruction provided by verbal, written material, models and posters to present the general  guidelines for heart healthy nutrition. Gives an explanation and review of dietary fats and fiber.   Pulmonary Rehab from 11/20/2018 in [facility] Cardiac and Pulmonary Rehab  Date  10/23/18  Educator  [facility]  Instruction Review Code  1- Verbalizes Understanding      Controlling Sodium/Reading Food Labels: -Group verbal and written material supporting the discussion of sodium  use in heart healthy nutrition. Review and explanation with models, verbal and written materials for utilization of the food label.   Pulmonary Rehab from 11/20/2018 in [facility] Cardiac and Pulmonary Rehab  Date  10/30/18  Educator  [facility]  Instruction Review Code  1- Verbalizes Understanding      Exercise Physiology & General Exercise Guidelines: - Group verbal and written instruction with models to review the exercise physiology of the cardiovascular system and associated critical values. Provides general exercise guidelines with specific guidelines to those with heart or lung disease.    Pulmonary Rehab from 11/20/2018 in [facility] Cardiac and Pulmonary Rehab  Date  10/02/18  Educator  [facility]  Instruction Review Code  1- Verbalizes Understanding      Aerobic Exercise & Resistance Training: - Gives group verbal and written instruction on the various components of exercise. Focuses on aerobic and resistive training programs and the benefits of this training and how to safely progress through these programs.   Pulmonary Rehab from 11/20/2018 in [facility] Cardiac and Pulmonary Rehab  Date  10/04/18  Educator  [facility]  Instruction Review Code  1- Verbalizes Understanding      Flexibility, Balance, Mind/Body Relaxation: Provides group verbal/written instruction on the benefits of flexibility and balance training, including mind/body exercise modes such as yoga, pilates and tai chi.  Demonstration and skill practice provided.   Pulmonary Rehab from 11/20/2018 in [facility] Cardiac and Pulmonary Rehab  Date  10/09/18  Educator  [facility]  Instruction Review Code   1- Verbalizes Understanding      Stress and Anxiety: - Provides group verbal and written instruction about the health risks of elevated stress and causes of high stress.  Discuss the correlation between heart/lung disease and anxiety and treatment options. Review healthy ways to manage with stress and anxiety.   Pulmonary Rehab from 11/20/2018 in [facility] Cardiac and Pulmonary Rehab  Date  10/16/18  Educator  [facility]  Instruction Review Code  1- Verbalizes Understanding      Depression: - Provides group verbal and written instruction on the correlation between heart/lung disease and depressed mood, treatment options, and the stigmas associated with seeking treatment.   Pulmonary Rehab from 11/20/2018 in [facility] Cardiac and Pulmonary Rehab  Date  11/13/18  Educator  [place]  Instruction Review Code  1- Verbalizes Understanding      Exercise & Equipment Safety: - Individual verbal instruction and demonstration of equipment use and safety with use of the equipment.   Pulmonary Rehab from 11/20/2018 in [facility] Cardiac and Pulmonary Rehab  Date  08/26/18  Educator  [facility]  Instruction Review Code  1- Verbalizes Understanding      Infection Prevention: - Provides verbal and written material to individual with discussion of infection control including proper hand washing and proper equipment cleaning during exercise session.   Pulmonary Rehab from 11/20/2018 in [facility] Cardiac and Pulmonary Rehab  Date  08/26/18  Educator  [facility]  Instruction Review Code  1- Verbalizes Understanding      Falls Prevention: - Provides verbal and written material to individual with discussion of falls prevention and safety.   Pulmonary Rehab from 11/20/2018 in [facility] Cardiac and Pulmonary Rehab  Date  08/26/18  Educator  [facility]  Instruction Review Code  1- Verbalizes Understanding      Diabetes: - Individual verbal and written instruction to review signs/symptoms of diabetes, desired ranges of glucose level fasting, after meals and  with exercise. Advice that pre and post exercise glucose checks will be done for  3 sessions at entry of program.   Chronic Lung Diseases: - Group verbal and written instruction to review updates, respiratory medications, advancements in procedures and treatments. Discuss use of supplemental oxygen including available portable oxygen systems, continuous and intermittent flow rates, concentrators, personal use and safety guidelines. Review proper use of inhaler and spacers. Provide informative websites for self-education.    Pulmonary Rehab from 11/20/2018 in [facility] Cardiac and Pulmonary Rehab  Date  10/18/18  Educator  [facility]  Instruction Review Code  1- Verbalizes Understanding      Energy Conservation: - Provide group verbal and written instruction for methods to conserve energy, plan and organize activities. Instruct on pacing techniques, use of adaptive equipment and posture/positioning to relieve shortness of breath.   Triggers and Exacerbations: - Group verbal and written instruction to review types of environmental triggers and ways to prevent exacerbations. Discuss weather changes, air quality and the benefits of nasal washing. Review warning signs and symptoms to help prevent infections. Discuss techniques for effective airway clearance, coughing, and vibrations.   AED/CPR: - Group verbal and written instruction with the use of models to demonstrate the basic use of the AED with the basic ABC's of resuscitation.   Pulmonary Rehab from 11/20/2018 in [facility] Cardiac and Pulmonary Rehab  Date  09/25/18  Educator  [facility]  Instruction Review Code  5- Refused Teaching      Anatomy and Physiology of the Lungs: - Group verbal and written instruction with the use of models to provide basic lung anatomy and physiology related to function, structure and complications of lung disease.   Pulmonary Rehab from 11/20/2018 in [facility] Cardiac and Pulmonary Rehab  Date  11/20/18  Educator  [facility]  Instruction Review  Code  1- Verbalizes Understanding      Anatomy & Physiology of the Heart: - Group verbal and written instruction and models provide basic cardiac anatomy and physiology, with the coronary electrical and arterial systems. Review of Valvular disease and Heart Failure   Cardiac Medications: - Group verbal and written instruction to review commonly prescribed medications for heart disease. Reviews the medication, class of the drug, and side effects.   Know Your Numbers and Risk Factors: -Group verbal and written instruction about important numbers in your health.  Discussion of what are risk factors and how they play a role in the disease process.  Review of Cholesterol, Blood Pressure, Diabetes, and BMI and the role they play in your overall health.   Pulmonary Rehab from 11/20/2018 in [facility] Cardiac and Pulmonary Rehab  Date  11/06/18  Educator  [facility]  Instruction Review Code  1- Verbalizes Understanding      Sleep Hygiene: -Provides group verbal and written instruction about how sleep can affect your health.  Define sleep hygiene, discuss sleep cycles and impact of sleep habits. Review good sleep hygiene tips.    Pulmonary Rehab from 11/20/2018 in [facility] Cardiac and Pulmonary Rehab  Date  09/18/18  Educator  [facility]  Instruction Review Code  1- Verbalizes Understanding      Other: -Provides group and verbal instruction on various topics (see comments)    Knowledge Questionnaire Score: Knowledge Questionnaire Score - 11/20/18 1028      Knowledge Questionnaire Score   Pre Score  15/18    Post Score  17/18   reviewed with patient       Core Components/Risk Factors/Patient Goals at Admission: Personal Goals and Risk Factors at Admission - 08/26/18 1458      Core Components/Risk Factors/Patient  Goals on Admission    Weight Management  Yes;Weight Loss    Intervention  Weight Management: Develop a combined nutrition and exercise program designed to reach desired caloric intake, while  maintaining appropriate intake of nutrient and fiber, sodium and fats, and appropriate energy expenditure required for the weight goal.;Weight Management: Provide education and appropriate resources to help participant work on and attain dietary goals.;Weight Management/Obesity: Establish reasonable short term and long term weight goals.    Admit Weight  183 lb 8 oz (83.2 kg)    Goal Weight: Short Term  178 lb (80.7 kg)    Goal Weight: Long Term  170 lb (77.1 kg)    Expected Outcomes  Short Term: Continue to assess and modify interventions until short term weight is achieved;Long Term: Adherence to nutrition and physical activity/exercise program aimed toward attainment of established weight goal;Weight Loss: Understanding of general recommendations for a balanced deficit meal plan, which promotes 1-2 lb weight loss per week and includes a negative energy balance of [phone] kcal/d;Understanding recommendations for meals to include 15-35% energy as protein, 25-35% energy from fat, 35-60% energy from carbohydrates, less than 270m of dietary cholesterol, 20-35 gm of total fiber daily;Understanding of distribution of calorie intake throughout the day with the consumption of 4-5 meals/snacks    Improve shortness of breath with ADL's  Yes    Intervention  Provide education, individualized exercise plan and daily activity instruction to help decrease symptoms of SOB with activities of daily living.    Expected Outcomes  Short Term: Improve cardiorespiratory fitness to achieve a reduction of symptoms when performing ADLs;Long Term: Be able to perform more ADLs without symptoms or delay the onset of symptoms       Core Components/Risk Factors/Patient Goals Review:  Goals and Risk Factor Review    Row Name 09/18/18 1146 10/16/18 1405 11/04/18 1102         Core Components/Risk Factors/Patient Goals Review   Personal Goals Review  Weight Management/Obesity;Improve shortness of breath with ADL's  Weight  Management/Obesity;Improve shortness of breath with ADL's;Heart Failure  Weight Management/Obesity;Improve shortness of breath with ADL's;Heart Failure     Review  Patient would like to lose a little bit of weight to help with his breathing. He is trying to attend class regularly since he has been out most of December.   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 He is wanting to get a lung transplant and his doctors state that he will have to do rehab that their facility. Informed patient to get as much out of the program as he can before then.  Patient states that he is taking all medication as prescribed. The [facility] team wants him to maintain his current weight, which he is trying to do.      Expected Outcomes  Short: Attend [facility] regularly to improve shortness of breath with ADL's. Long: maintain independence with ADL's   Short: attend [facility] regularly. Long: Do the Lung Transplant program.  Short: attend [facility] regulartly and continue to take medications. Long: [facility]         Core Components/Risk Factors/Patient Goals at Discharge (Final Review):  Goals and Risk Factor Review - 11/04/18 1102      Core Components/Risk Factors/Patient Goals Review   Personal Goals Review  Weight Management/Obesity;Improve shortness of breath with ADL's;Heart Failure    Review  Patient states that he is taking all medication as prescribed. The [facility] team wants  him to maintain his current weight, which he is trying to do.     Expected Outcomes  Short: attend [facility] regulartly and continue to take medications. Long: Do the Lung Transplant program at [facility].        ITP Comments: ITP Comments    Row Name 08/26/18 1425 10/21/18 0829 11/18/18 0816 11/26/18 1027     ITP Comments  Medical Evaluation completed. Chart sent for review and changes to Dr. Emily Filbert Director of [place]. Diagnosis can be found in Care everywhere  08/15/18  30 day review completed. ITP sent to Dr. Emily Filbert Director of [place]. Continue with ITP unless changes are made by physician.  30 day review completed. ITP sent to Dr. Emily Filbert Director of [place]. Continue with ITP unless changes are made by physician.  Discharge ITP sent and signed by Dr. Sabra Heck.  Discharge Summary routed to PCP and pulmonologist.       Comments: Discharge ITP

## 2018-11-26 NOTE — Progress Notes (Signed)
Daily Session Note  Patient Details  Townsend: Caleb Townsend MRN: [id number] Date of Birth: 1951/07/28 Referring Provider:     Pulmonary Rehab from 08/26/2018 in [facility]  Referring Provider  Rosana Berger MD      Encounter Date: 11/26/2018  Check In: Session Check In - 11/26/18 1026      Check-In   Supervising physician immediately available to respond to emergencies  [facility] immediately available ER MD    Physician(s)  Dr. Alfred Levins and Mariea Clonts    Location  [facility]-Cardiac & Pulmonary Rehab    Staff Present  Alberteen Sam, MA, RCEP, CCRP, Exercise Physiologist;Gedeon Brandow Tessie Fass RCP,RRT,BSRT    Medication changes reported      No    Fall or balance concerns reported     No    Warm-up and Cool-down  Not performed (comment)   only 6 minute walk test, patient graduating   Resistance Training Performed  No    VAD Patient?  No    PAD/SET Patient?  No      Pain Assessment   Currently in Pain?  No/denies          Social History   Tobacco Use  Smoking Status Former Smoker  . Packs/day: 1.00  . Years: 30.00  . Pack years: 30.00  . Types: Cigarettes  . Last attempt to quit: 09/12/2007  . Years since quitting: 11.2  Smokeless Tobacco Former Systems developer  . Quit date: 09/11/2012    Goals Met:  Proper associated with RPD/PD & O2 Sat Independence with exercise equipment Improved SOB with ADL's Using PLB without cueing & demonstrates good technique Exercise tolerated well No report of cardiac concerns or symptoms Strength training completed today  Goals Unmet:  Not Applicable  Comments:  Caleb Townsend 08/26/18 1458 11/26/18 1027       6 Minute Walk   Phase  Initial  Discharge    Distance  1410 feet  1650 feet    Distance % Change  -  17 %    Distance Feet Change  -  240 ft    Walk Time  6 minutes  6 minutes    # of Rest Breaks  0  0    MPH  2.67  3.13    METS  4.06  4.35    RPE  12  11    Perceived Dyspnea   2  3    VO2 Peak   14.22  15.23    Symptoms  Yes (comment)  -    Comments  SOB  SOB    Resting HR  107 bpm  109 bpm    Resting BP  126/70  114/74    Resting Oxygen Saturation   95 %  95 %    Exercise Oxygen Saturation  during 6 min walk  87 %  83 %    Max Ex. HR  134 bpm  126 bpm    Max Ex. BP  164/84  154/72    2 Minute Post BP  126/72  132/62      Interval HR   1 Minute HR  110  118    2 Minute HR  109  126    3 Minute HR  109  126    4 Minute HR  109  -    5 Minute HR  109  -    6 Minute HR  134  124    2 Minute  Post HR  115  115    Interval Heart Rate?  Yes  Yes      Interval Oxygen   Interval Oxygen?  Yes  Yes    Baseline Oxygen Saturation %  95 %  95 %    1 Minute Oxygen Saturation %  92 %  95 %    1 Minute Liters of Oxygen  0 L Room Air  5 L pulsed    2 Minute Oxygen Saturation %  88 %  90 %    2 Minute Liters of Oxygen  0 L  5 L    3 Minute Oxygen Saturation %  88 %  85 %    3 Minute Liters of Oxygen  0 L  5 L    4 Minute Oxygen Saturation %  87 %  85 %    4 Minute Liters of Oxygen  0 L  5 L    5 Minute Oxygen Saturation %  88 %  83 %    5 Minute Liters of Oxygen  0 L  5 L    6 Minute Oxygen Saturation %  87 %  83 %    6 Minute Liters of Oxygen  0 L  5 L    2 Minute Post Oxygen Saturation %  95 %  96 %    2 Minute Post Liters of Oxygen  0 L  5 L       Caleb Townsend graduated today from  rehab with 27 sessions completed.  Details of the patient's exercise prescription and what He needs to do in order to continue the prescription and progress were discussed with patient.  Patient was given a copy of prescription and goals.  Patient verbalized understanding.  Caleb Townsend plans to continue to exercise by going to [facility] rehab for a lung Transplant.  Dr. Emily Filbert is Medical Director for [place] and [facility].

## 2018-11-26 NOTE — Progress Notes (Signed)
Discharge Progress Report  Patient Details  Name: Caleb Townsend MRN: [id number] Date of Birth: 15-Feb-1951 Referring Provider:     Pulmonary Rehab from 08/26/2018 in [facility]  Referring Provider  Rosana Berger MD       Number of Visits: 27/36  Reason for Discharge:  Patient reached a stable level of exercise. Patient independent in their exercise. Patient has met program and personal goals.  Smoking History:  Social History   Tobacco Use  Smoking Status Former Smoker  . Packs/day: 1.00  . Years: 30.00  . Pack years: 30.00  . Types: Cigarettes  . Last attempt to quit: 09/12/2007  . Years since quitting: 11.2  Smokeless Tobacco Former Systems developer  . Quit date: 09/11/2012    Diagnosis:  IPF (idiopathic pulmonary fibrosis) ([place])  ADL [facility]: Pulmonary Assessment Scores    Row Name 08/26/18 1431 11/20/18 1028 11/26/18 1030     ADL [facility]   ADL Phase  Entry  Exit  Exit   SOB Score total  51  86  86   Rest  0  1  1   Walk  _0 Stairs  _1 Bath  _2 Dress  _3 Shop  _4 CAT Score   CAT Score  _5 mMRC Score   mMRC Score  2  -  2      Initial Exercise Prescription: Initial Exercise Prescription - 08/26/18 1500      Date of Initial Exercise RX and Referring Provider   Date  08/26/18    Referring Provider  Rosana Berger MD      Treadmill   MPH  2.5    Grade  1    Minutes  15    METs  3.26      Recumbant Elliptical   Level  2    RPM  50    Minutes  15    METs  3      T5 Nustep   Level  3    SPM  80    Minutes  15    METs  3.2      Prescription Details   Frequency (times per week)  3    Duration  Progress to 45 minutes of aerobic exercise without signs/symptoms of physical distress      Intensity   THRR 40-80% of Max Heartrate  128/143    Ratings of Perceived Exertion  11-13    Perceived Dyspnea  0-4      Progression   Progression  Continue to progress workloads to  maintain intensity without signs/symptoms of physical distress.      Resistance Training   Training Prescription  Yes    Weight  4 lbs    Reps  10-15       Discharge Exercise Prescription (Final Exercise Prescription Changes): Exercise Prescription Changes - 11/19/18 1200      Response to Exercise   Blood Pressure (Admit)  110/72    Blood Pressure (Exercise)  128/70    Blood Pressure (Exit)  110/60    Heart Rate (Admit)  105 bpm    Heart Rate (Exercise)  117 bpm    Heart Rate (Exit)  99 bpm    Oxygen Saturation (Admit)  94 %  Oxygen Saturation (Exercise)  90 %    Oxygen Saturation (Exit)  92 %    Rating of Perceived Exertion (Exercise)  12    Perceived Dyspnea (Exercise)  2    Symptoms  none    Duration  Continue with 45 min of aerobic exercise without signs/symptoms of physical distress.    Intensity  THRR unchanged      Progression   Progression  Continue to progress workloads to maintain intensity without signs/symptoms of physical distress.    Average METs  2.5      Resistance Training   Training Prescription  Yes    Weight  4 lb    Reps  10-15      Oxygen   Oxygen  Continuous    Liters  3      Treadmill   MPH  2.5    Grade  1    Minutes  15    METs  3.26      Recumbant Elliptical   Level  3    RPM  50    Minutes  15    METs  2.2      T5 Nustep   Level  4    SPM  80    Minutes  15    METs  2.1       Functional Capacity: 6 Minute Walk    Row Name 08/26/18 1458 11/26/18 1027       6 Minute Walk   Phase  Initial  Discharge    Distance  1410 feet  1650 feet    Distance % Change  -  17 %    Distance Feet Change  -  240 ft    Walk Time  6 minutes  6 minutes    # of Rest Breaks  0  0    MPH  2.67  3.13    METS  4.06  4.35    RPE  12  11    Perceived Dyspnea   2  3    VO2 Peak  14.22  15.23    Symptoms  Yes (comment)  -    Comments  SOB  SOB    Resting HR  107 bpm  109 bpm    Resting BP  126/70  114/74    Resting Oxygen Saturation   95 %   95 %    Exercise Oxygen Saturation  during 6 min walk  87 %  83 %    Max Ex. HR  134 bpm  126 bpm    Max Ex. BP  164/84  154/72    2 Minute Post BP  126/72  132/62      Interval HR   1 Minute HR  110  118    2 Minute HR  109  126    3 Minute HR  109  126    4 Minute HR  109  -    5 Minute HR  109  -    6 Minute HR  134  124    2 Minute Post HR  115  115    Interval Heart Rate?  Yes  Yes      Interval Oxygen   Interval Oxygen?  Yes  Yes    Baseline Oxygen Saturation %  95 %  95 %    1 Minute Oxygen Saturation %  92 %  95 %    1 Minute Liters of Oxygen  0 L Room Air  5 L pulsed    2 Minute Oxygen Saturation %  88 %  90 %    2 Minute Liters of Oxygen  0 L  5 L    3 Minute Oxygen Saturation %  88 %  85 %    3 Minute Liters of Oxygen  0 L  5 L    4 Minute Oxygen Saturation %  87 %  85 %    4 Minute Liters of Oxygen  0 L  5 L    5 Minute Oxygen Saturation %  88 %  83 %    5 Minute Liters of Oxygen  0 L  5 L    6 Minute Oxygen Saturation %  87 %  83 %    6 Minute Liters of Oxygen  0 L  5 L    2 Minute Post Oxygen Saturation %  95 %  96 %    2 Minute Post Liters of Oxygen  0 L  5 L       Psychological, QOL, Others - Outcomes: PHQ 2/9: Depression screen [facility] 2/9 11/20/2018 08/26/2018  Decreased Interest 1 1  Down, Depressed, Hopeless 0 0  PHQ - 2 Score 1 1  Altered sleeping 0 1  Tired, decreased energy 2 2  Change in appetite 1 0  Feeling bad or failure about yourself  0 0  Trouble concentrating 1 0  Moving slowly or fidgety/restless 0 0  Suicidal thoughts 0 0  PHQ-9 Score 5 4  Difficult doing work/chores Somewhat difficult Somewhat difficult    Quality of Life:   Personal Goals: Goals established at orientation with interventions provided to work toward goal. Personal Goals and Risk Factors at Admission - 08/26/18 1458      Core Components/Risk Factors/Patient Goals on Admission    Weight Management  Yes;Weight Loss    Intervention  Weight Management: Develop a  combined nutrition and exercise program designed to reach desired caloric intake, while maintaining appropriate intake of nutrient and fiber, sodium and fats, and appropriate energy expenditure required for the weight goal.;Weight Management: Provide education and appropriate resources to help participant work on and attain dietary goals.;Weight Management/Obesity: Establish reasonable short term and long term weight goals.    Admit Weight  183 lb 8 oz (83.2 kg)    Goal Weight: Short Term  178 lb (80.7 kg)    Goal Weight: Long Term  170 lb (77.1 kg)    Expected Outcomes  Short Term: Continue to assess and modify interventions until short term weight is achieved;Long Term: Adherence to nutrition and physical activity/exercise program aimed toward attainment of established weight goal;Weight Loss: Understanding of general recommendations for a balanced deficit meal plan, which promotes 1-2 lb weight loss per week and includes a negative energy balance of [phone] kcal/d;Understanding recommendations for meals to include 15-35% energy as protein, 25-35% energy from fat, 35-60% energy from carbohydrates, less than 26m of dietary cholesterol, 20-35 gm of total fiber daily;Understanding of distribution of calorie intake throughout the day with the consumption of 4-5 meals/snacks    Improve shortness of breath with ADL's  Yes    Intervention  Provide education, individualized exercise plan and daily activity instruction to help decrease symptoms of SOB with activities of daily living.    Expected Outcomes  Short Term: Improve cardiorespiratory fitness to achieve a reduction of symptoms when performing ADLs;Long Term: Be able to perform more ADLs without symptoms or delay the onset of symptoms  Personal Goals Discharge: Goals and Risk Factor Review    Row Name 09/18/18 1146 10/16/18 1405 11/04/18 1102         Core Components/Risk Factors/Patient Goals Review   Personal Goals Review  Weight  Management/Obesity;Improve shortness of breath with ADL's  Weight Management/Obesity;Improve shortness of breath with ADL's;Heart Failure  Weight Management/Obesity;Improve shortness of breath with ADL's;Heart Failure     Review  Patient would like to lose a little bit of weight to help with his breathing. He is trying to attend class regularly since he has been out most of December.   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 He is wanting to get a lung transplant and his doctors state that he will have to do rehab that their facility. Informed patient to get as much out of the program as he can before then.  Patient states that he is taking all medication as prescribed. The [facility]plant team wants him to maintain his current weight, which he is trying to do.      Expected Outcomes  Short: Attend [facility] regularly to improve shortness of breath with ADL's. Long: maintain independence with ADL's   Short: attend [facility] regularly. Long: Do the Lung Transplant program.  Short: attend [facility] regulartly and continue to take medications. Long: Do the Lung Transplant program at [facility].         Exercise Goals and Review: Exercise Goals    Row Name 08/26/18 1502             Exercise Goals   Increase Physical Activity  Yes       Intervention  Provide advice, education, support and counseling about physical activity/exercise needs.;Develop an individualized exercise prescription for aerobic and resistive training based on initial evaluation findings, risk stratification, comorbidities and participant's personal goals.       Expected Outcomes  Short Term: Attend rehab on a regular basis to increase amount of physical activity.;Long Term: Add in home exercise to make exercise part of routine and to increase amount of physical activity.;Long Term: Exercising regularly at least 3-5 days a week.       Increase Strength and Stamina  Yes       Intervention  Provide advice,  education, support and counseling about physical activity/exercise needs.;Develop an individualized exercise prescription for aerobic and resistive training based on initial evaluation findings, risk stratification, comorbidities and participant's personal goals.       Expected Outcomes  Short Term: Increase workloads from initial exercise prescription for resistance, speed, and METs.;Short Term: Perform resistance training exercises routinely during rehab and add in resistance training at home;Long Term: Improve cardiorespiratory fitness, muscular endurance and strength as measured by increased METs and functional capacity (6MWT)       Able to understand and use rate of perceived exertion (RPE) scale  Yes       Intervention  Provide education and explanation on how to use RPE scale       Expected Outcomes  Short Term: Able to use RPE daily in rehab to express subjective intensity level;Long Term:  Able to use RPE to guide intensity level when exercising independently       Knowledge and understanding of Target Heart Rate Range (THRR)  Yes       Intervention  Provide education and explanation of THRR including how the numbers were predicted and where they are located for reference       Expected Outcomes  Short Term: Able to state/look  up THRR;Long Term: Able to use THRR to govern intensity when exercising independently;Short Term: Able to use daily as guideline for intensity in rehab       Able to check pulse independently  Yes       Intervention  Provide education and demonstration on how to check pulse in carotid and radial arteries.;Review the importance of being able to check your own pulse for safety during independent exercise       Expected Outcomes  Short Term: Able to explain why pulse checking is important during independent exercise;Long Term: Able to check pulse independently and accurately       Understanding of Exercise Prescription  Yes       Intervention  Provide education, explanation,  and written materials on patient's individual exercise prescription       Expected Outcomes  Short Term: Able to explain program exercise prescription;Long Term: Able to explain home exercise prescription to exercise independently          Exercise Goals Re-Evaluation: Exercise Goals Re-Evaluation    Row Name 09/16/18 1024 09/25/18 1542 09/27/18 1032 10/10/18 0947 10/22/18 1012     Exercise Goal Re-Evaluation   Exercise Goals Review  Increase Physical Activity;Increase Strength and Stamina;Able to understand and use rate of perceived exertion (RPE) scale;Knowledge and understanding of Target Heart Rate Range (THRR);Understanding of Exercise Prescription;Able to understand and use Dyspnea scale  Increase Physical Activity;Increase Strength and Stamina;Able to understand and use rate of perceived exertion (RPE) scale;Able to understand and use Dyspnea scale;Knowledge and understanding of Target Heart Rate Range (THRR);Understanding of Exercise Prescription  Increase Physical Activity;Able to understand and use rate of perceived exertion (RPE) scale;Knowledge and understanding of Target Heart Rate Range (THRR);Understanding of Exercise Prescription;Increase Strength and Stamina;Able to understand and use Dyspnea scale;Able to check pulse independently  Increase Physical Activity;Increase Strength and Stamina;Able to understand and use rate of perceived exertion (RPE) scale;Able to understand and use Dyspnea scale;Understanding of Exercise Prescription  Increase Physical Activity;Increase Strength and Stamina;Able to understand and use rate of perceived exertion (RPE) scale;Able to understand and use Dyspnea scale;Knowledge and understanding of Target Heart Rate Range (THRR);Able to check pulse independently;Understanding of Exercise Prescription   Comments  Reviewed RPE scale, THR and program prescription with pt today.  Pt voiced understanding and was given a copy of goals to take home.   Mortimer Fries has  tolerated exercise well.  He needs supplemental oxygen on the TM only.  Staff will monitor progress.   Reviewed home exercise with pt today.  Pt plans to go to [facility] for exercise.  Reviewed THR, pulse, RPE, sign and symptoms, NTG use, and when to call 911 or MD.  Also discussed weather considerations and indoor options.  Pt voiced understanding.  Mortimer Fries continues to tolerate exercise well.  He has increased 02 on TM to 3 L to maintain 02 above 88%.    Bobby  tolerates exercise well.  He has been through preliminary testing at [facility] for lung transplant.  Staff will monitor progress.   Expected Outcomes  Short: Use RPE daily to regulate intensity. Long: Follow program prescription in THR.  Short - attend classs consistently Long - increase MET level  Short - check oxygen when exercising outside program classes one day per week Long - maintain exercise independently  Short - increase levels on seated machines Long - increase overall MET level  Short - continue to attend consistently Long - increase MET level   Row Name 11/04/18 1106 11/06/18  1307 11/19/18 1254         Exercise Goal Re-Evaluation   Exercise Goals Review  Increase Physical Activity;Increase Strength and Stamina;Able to understand and use rate of perceived exertion (RPE) scale;Knowledge and understanding of Target Heart Rate Range (THRR);Understanding of Exercise Prescription;Able to understand and use Dyspnea scale  Increase Physical Activity;Increase Strength and Stamina;Able to understand and use rate of perceived exertion (RPE) scale;Able to understand and use Dyspnea scale;Knowledge and understanding of Target Heart Rate Range (THRR);Understanding of Exercise Prescription  Increase Physical Activity;Increase Strength and Stamina;Able to understand and use rate of perceived exertion (RPE) scale;Able to understand and use Dyspnea scale;Knowledge and understanding of Target Heart Rate Range (THRR);Able to check pulse  independently;Understanding of Exercise Prescription     Comments  Mortimer Fries does tolerate  exercise during class well, but states that his SOB continues to be his major limiting factor. He stated that he feels like physically he could push himself more if the SOB was not so bad. He will go for his 5 day evaluation with the [facility] transplant team starting March 19th, and if all goes well will start with [facility] rehab until lungs become available. Mortimer Fries attends consistently and works as much as he is able.  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  Matthewjames contiues to tolerate exercise well.  He has eval at [facility] soon for lung transplant.     Expected Outcomes  Short: continue to attend [facility] regularly. 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   Short - continue to attend consistently Long - meet exercise goals for transplant   Short - complete LW program Long - maintain exercise on his own        Nutrition & Weight - Outcomes: Pre Biometrics - 08/26/18 1502      Pre Biometrics   Height  5' 11.25" (1.81 m)    Weight  183 lb 8 oz (83.2 kg)    Waist Circumference  34 inches    Hip Circumference  39 inches    Waist to Hip Ratio  0.87 %    BMI (Calculated)  25.41      Post Biometrics - 11/26/18 1029       Post  Biometrics   Height  5' 11.25" (1.81 m)    Weight  176 lb 14.4 oz (80.2 kg)    Waist Circumference  34.5 inches    Hip Circumference  39 inches    Waist to Hip Ratio  0.88 %    BMI (Calculated)  24.49       Nutrition: Nutrition Therapy & Goals - 09/18/18 1357      Nutrition Therapy   Diet  TLC    Protein (specify units)  8oz    Fiber  35 grams    Whole Grain Foods  3 servings   does not typically choose whole grains   Saturated Fats  14 max. grams    Fruits and Vegetables  6 servings/day   8 ideal, tries to eat fruits/ vegetables daily   Sodium  1500 grams      Personal Nutrition Goals   Nutrition  Goal  Continue to work on decreasing the amount of sugar added to beverages like sweet tea, great job for getting started with this!    Personal Goal #2  Select lean cuts / options for red meats since you eat them more often than most other protein sources  Comments  States he tries to eat a diet that is lower in fat and cholesterol. He has been using honey to sweeten things like coffee rather than table sugar and has been eating breakfast consistently d/t taking a new medication. Breakfast: cereal with 2% milk, yogurt, sometimes eggs / peanut butter. Lunch: (out 2-3x/wk) burger, [employer] sub, [place] sandwich on white bread, leftovers. Dinner: (out 1-2x/wk to [employer]) boston butt, squash, corn, steak, baked potato, salad, fish (baked or fried occasionally), pork, shrimp, some chicken but does not prefer it, spaghetti with ground beef. Snacks: fruit, ice cream occasionally. Beverages: water, sweet tea, used to drink beer when golfing but has been unable to golf as of late. His wife does not cook with salt but he does add salt at the table      Intervention Plan   Intervention  Prescribe, educate and counsel regarding individualized specific dietary modifications aiming towards targeted core components such as weight, hypertension, lipid management, diabetes, heart failure and other comorbidities.    Expected Outcomes  Short Term Goal: Understand basic principles of dietary content, such as calories, fat, sodium, cholesterol and nutrients.;Short Term Goal: A plan has been developed with personal nutrition goals set during dietitian appointment.;Long Term Goal: Adherence to prescribed nutrition plan.       Nutrition Discharge: Nutrition Assessments - 11/20/18 1029      MEDFICTS Scores   Pre Score  43    Post Score  32    Score Difference  -11       Education Questionnaire Score: Knowledge Questionnaire Score - 11/20/18 1028      Knowledge Questionnaire Score   Pre Score  15/18    Post Score   17/18   reviewed with patient      Goals reviewed with patient; copy given to patient.

## 2019-01-22 DIAGNOSIS — Z942 Lung transplant status: Secondary | ICD-10-CM

## 2019-01-22 HISTORY — PX: LUNG TRANSPLANT: SHX234

## 2019-01-22 HISTORY — DX: Lung transplant status: Z94.2

## 2019-01-31 HISTORY — PX: LARYNGOSCOPY: SUR817

## 2019-03-10 HISTORY — PX: GREATER OMENTAL FLAP CLOSURE: SHX6319

## 2021-08-30 ENCOUNTER — Other Ambulatory Visit: Payer: Self-pay | Admitting: Gastroenterology

## 2021-08-30 DIAGNOSIS — K432 Incisional hernia without obstruction or gangrene: Secondary | ICD-10-CM

## 2021-09-08 ENCOUNTER — Ambulatory Visit: Payer: Medicare Other

## 2021-09-13 ENCOUNTER — Ambulatory Visit
Admission: RE | Admit: 2021-09-13 | Discharge: 2021-09-13 | Disposition: A | Discharge status: Home / Self Care | Payer: Medicare Other | Source: Ambulatory Visit | Attending: Gastroenterology | Admitting: Gastroenterology

## 2021-09-13 ENCOUNTER — Other Ambulatory Visit: Payer: Self-pay

## 2021-09-13 DIAGNOSIS — K432 Incisional hernia without obstruction or gangrene: Secondary | ICD-10-CM | POA: Insufficient documentation

## 2022-12-06 ENCOUNTER — Encounter: Payer: Self-pay | Admitting: Ophthalmology

## 2022-12-14 NOTE — Discharge Instructions (Signed)

## 2022-12-18 ENCOUNTER — Encounter
Admission: RE | Disposition: A | Discharge status: Home / Self Care | Payer: Self-pay | Source: Home / Self Care | Attending: Ophthalmology

## 2022-12-18 ENCOUNTER — Other Ambulatory Visit: Payer: Self-pay

## 2022-12-18 ENCOUNTER — Ambulatory Visit: Payer: Medicare Other | Admitting: Anesthesiology

## 2022-12-18 ENCOUNTER — Encounter: Payer: Self-pay | Admitting: Ophthalmology

## 2022-12-18 ENCOUNTER — Ambulatory Visit
Admission: RE | Admit: 2022-12-18 | Discharge: 2022-12-18 | Disposition: A | Discharge status: Home / Self Care | Payer: Medicare Other | Attending: Ophthalmology | Admitting: Ophthalmology

## 2022-12-18 DIAGNOSIS — Z8546 Personal history of malignant neoplasm of prostate: Secondary | ICD-10-CM | POA: Diagnosis not present

## 2022-12-18 DIAGNOSIS — E039 Hypothyroidism, unspecified: Secondary | ICD-10-CM | POA: Insufficient documentation

## 2022-12-18 DIAGNOSIS — Z942 Lung transplant status: Secondary | ICD-10-CM | POA: Diagnosis not present

## 2022-12-18 DIAGNOSIS — Z87891 Personal history of nicotine dependence: Secondary | ICD-10-CM | POA: Diagnosis not present

## 2022-12-18 DIAGNOSIS — H2511 Age-related nuclear cataract, right eye: Secondary | ICD-10-CM | POA: Diagnosis present

## 2022-12-18 HISTORY — DX: Gastro-esophageal reflux disease without esophagitis: K21.9

## 2022-12-18 HISTORY — DX: Personal history of other diseases of the digestive system: Z87.19

## 2022-12-18 HISTORY — DX: Pulmonary fibrosis, unspecified: J84.10

## 2022-12-18 HISTORY — PX: CATARACT EXTRACTION W/PHACO: SHX586

## 2022-12-18 HISTORY — DX: Hypothyroidism, unspecified: E03.9

## 2022-12-18 HISTORY — DX: Malignant neoplasm of prostate: C61

## 2022-12-18 SURGERY — PHACOEMULSIFICATION, CATARACT, WITH IOL INSERTION
Anesthesia: Topical | Site: Eye | Laterality: Right

## 2022-12-18 MED ORDER — MOXIFLOXACIN HCL 0.5 % OP SOLN
OPHTHALMIC | Status: DC | PRN
  Administered 2022-12-18: .2 mL via OPHTHALMIC

## 2022-12-18 MED ORDER — LIDOCAINE HCL (PF) 2 % IJ SOLN
INTRAOCULAR | Status: DC | PRN
  Administered 2022-12-18: 1 mL via INTRAOCULAR

## 2022-12-18 MED ORDER — SIGHTPATH DOSE#1 BSS IO SOLN
INTRAOCULAR | Status: DC | PRN
  Administered 2022-12-18: 15 mL

## 2022-12-18 MED ORDER — ARMC OPHTHALMIC DILATING DROPS
1.0000 | OPHTHALMIC | Status: DC | PRN
  Administered 2022-12-18 (×3): 1 via OPHTHALMIC

## 2022-12-18 MED ORDER — MIDAZOLAM HCL 2 MG/2ML IJ SOLN
INTRAMUSCULAR | Status: DC | PRN
  Administered 2022-12-18: 2 mg via INTRAVENOUS

## 2022-12-18 MED ORDER — SIGHTPATH DOSE#1 NA HYALUR & NA CHOND-NA HYALUR IO KIT
PACK | INTRAOCULAR | Status: DC | PRN
  Administered 2022-12-18: 1 via OPHTHALMIC

## 2022-12-18 MED ORDER — SIGHTPATH DOSE#1 BSS IO SOLN
INTRAOCULAR | Status: DC | PRN
  Administered 2022-12-18: 89 mL via OPHTHALMIC

## 2022-12-18 MED ORDER — TETRACAINE HCL 0.5 % OP SOLN
1.0000 [drp] | OPHTHALMIC | Status: DC | PRN
  Administered 2022-12-18 (×3): 1 [drp] via OPHTHALMIC

## 2022-12-18 MED ORDER — LACTATED RINGERS IV SOLN: INTRAVENOUS | Status: DC

## 2022-12-18 SURGICAL SUPPLY — 15 items
CATARACT SUITE SIGHTPATH (MISCELLANEOUS) ×1 IMPLANT
DISSECTOR HYDRO NUCLEUS 50X22 (MISCELLANEOUS) ×1 IMPLANT
FEE CATARACT SUITE SIGHTPATH (MISCELLANEOUS) ×1 IMPLANT
GLOVE SURG GAMMEX PI TX LF 7.5 (GLOVE) ×1 IMPLANT
GLOVE SURG SYN 8.5  E (GLOVE) ×1
GLOVE SURG SYN 8.5 E (GLOVE) ×1 IMPLANT
GLOVE SURG SYN 8.5 PF PI (GLOVE) ×1 IMPLANT
LENS IOL EYHANCE TORIC   21.5 ×1 IMPLANT
LENS IOL EYHANCE TORIC  21.5 ×1 IMPLANT
LENS IOL EYHANCE TRC 300 21.5 IMPLANT
NDL FILTER BLUNT 18X1 1/2 (NEEDLE) ×1 IMPLANT
NEEDLE FILTER BLUNT 18X1 1/2 (NEEDLE) ×1 IMPLANT
SYR 3ML LL SCALE MARK (SYRINGE) ×1 IMPLANT
SYR 5ML LL (SYRINGE) ×1 IMPLANT
WATER STERILE IRR 250ML POUR (IV SOLUTION) ×1 IMPLANT

## 2022-12-18 NOTE — Anesthesia Postprocedure Evaluation (Signed)
Anesthesia Post Note  Patient: Caleb Townsend  Procedure(s) Performed: CATARACT EXTRACTION PHACO AND INTRAOCULAR LENS PLACEMENT (IOC) RIGHT EYHANCE  TORIC LENS  2.68  00:25.3 (Right: Eye)  Patient location during evaluation: PACU Anesthesia Type: MAC Level of consciousness: awake and alert Pain management: pain level controlled Vital Signs Assessment: post-procedure vital signs reviewed and stable Respiratory status: spontaneous breathing, nonlabored ventilation, respiratory function stable and patient connected to nasal cannula oxygen Cardiovascular status: stable and blood pressure returned to baseline Postop Assessment: no apparent nausea or vomiting Anesthetic complications: no   No notable events documented.   Last Vitals:  Vitals:   12/18/22 1230 12/18/22 1236  BP: 127/72 135/69  Pulse: (!) 58 (!) 56  Resp: 14 20  Temp: (!) 36.3 C (!) 36.3 C  SpO2: 96% 95%    Last Pain:  Vitals:   12/18/22 1236  TempSrc:   PainSc: 0-No pain                 Deondra Wigger C Lakeshia Dohner

## 2022-12-18 NOTE — Anesthesia Preprocedure Evaluation (Addendum)
Anesthesia Evaluation  Patient identified by MRN, date of birth, ID band Patient awake    Reviewed: Allergy & Precautions, H&P , NPO status , Patient's Chart, lab work & pertinent test results  Airway Mallampati: II  TM Distance: >3 FB Neck ROM: Full    Dental   Pulmonary neg pulmonary ROS, former smoker   Pulmonary exam normal breath sounds clear to auscultation       Cardiovascular negative cardio ROS Normal cardiovascular exam Rhythm:Regular Rate:Normal     Neuro/Psych negative neurological ROS  negative psych ROS   GI/Hepatic negative GI ROS, Neg liver ROS, hiatal hernia,GERD  ,,  Endo/Other  negative endocrine ROSHypothyroidism    Renal/GU negative Renal ROS  negative genitourinary   Musculoskeletal negative musculoskeletal ROS (+)    Abdominal   Peds negative pediatric ROS (+)  Hematology negative hematology ROS (+)   Anesthesia Other Findings   Vertigo GERD (gastroesophageal reflux disease) Prostate cancer  Pulmonary fibrosis Hypothyroidism  History of hiatal hernia S/P lung transplant      Reproductive/Obstetrics negative OB ROS                             Anesthesia Physical Anesthesia Plan  ASA: 3  Anesthesia Plan: MAC   Post-op Pain Management:    Induction: Intravenous  PONV Risk Score and Plan:   Airway Management Planned: Natural Airway and Nasal Cannula  Additional Equipment:   Intra-op Plan:   Post-operative Plan:   Informed Consent: I have reviewed the patients History and Physical, chart, labs and discussed the procedure including the risks, benefits and alternatives for the proposed anesthesia with the patient or authorized representative who has indicated his/her understanding and acceptance.     Dental Advisory Given  Plan Discussed with: Anesthesiologist, CRNA and Surgeon  Anesthesia Plan Comments: (Patient consented for risks of anesthesia  including but not limited to:  - adverse reactions to medications - damage to eyes, teeth, lips or other oral mucosa - nerve damage due to positioning  - sore throat or hoarseness - Damage to heart, brain, nerves, lungs, other parts of body or loss of life  Patient voiced understanding.)       Anesthesia Quick Evaluation

## 2022-12-18 NOTE — H&P (Signed)
[facility]   Primary Care Physician:  Ralph Dowdy, DO Ophthalmologist: Dr. Willey Blade  Pre-Procedure History & Physical: HPI:  Caleb Townsend is a 72 y.o. male here for cataract surgery.   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 lumbar   BRONCHOSCOPY     GREATER OMENTAL FLAP CLOSURE  03/10/2019   LARYNGOSCOPY  01/31/2019   LUNG TRANSPLANT Bilateral 01/22/2019   TONSILLECTOMY  1956   TOTAL HIP ARTHROPLASTY Right 2013    Prior to Admission medications   Medication Sig Start Date End Date Taking? Authorizing Provider  ASPIRIN 81 PO Take by mouth daily with breakfast.   Yes [provider]  azaTHIOprine (IMURAN) 50 MG tablet Take 50 mg by mouth daily with supper.   Yes [provider]  dapsone 100 MG tablet Take 100 mg by mouth daily with breakfast.   Yes [provider]  finasteride (PROSCAR) 5 MG tablet Take 5 mg by mouth at bedtime.   Yes [provider]  gabapentin (NEURONTIN) 100 MG capsule Take 100 mg by mouth 2 (two) times daily. 200 mg with breakfast, 100 mg at bedtime   Yes [provider]  levothyroxine (SYNTHROID) 50 MCG tablet Take 50 mcg by mouth daily before breakfast.   Yes [provider]  MAGNESIUM PO Take 266 mg by mouth 2 (two) times daily.   Yes [provider]  melatonin 5 MG TABS Take 5 mg by mouth at bedtime as needed.   Yes [provider]  prednisoLONE 5 MG TABS tablet Take 5 mg by mouth daily with breakfast.   Yes [provider]  tacrolimus (PROGRAF) 1 MG capsule Take 2 mg by mouth 2 (two) times daily.   Yes [provider]  HYDROmorphone (DILAUDID) 2 MG tablet Take by  mouth every 6 (six) hours as needed for severe pain. Patient not taking: Reported on 12/06/2022    [provider]  levocetirizine (XYZAL) 5 MG tablet Take 5 mg by mouth daily as needed for allergies. Patient not taking: Reported on 12/06/2022    [provider]    Allergies as of 11/15/2022   (Not on File)    History reviewed. No pertinent family history.  Social History   Socioeconomic History   Marital status: Married    Spouse name: Not on file   Number of children: Not on file   Years of education: Not on file   Highest education level: Not on file  Occupational History   Not on file  Tobacco Use   Smoking status: Former    Packs/day: 1.00    Years: 30.00    Additional pack years: 0.00    Total pack years: 30.00    Types: Cigarettes    Quit date: 09/12/2007    Years since quitting: 15.2   Smokeless tobacco: Former    Quit date: 09/11/2012  Vaping Use   Vaping Use: Never used  Substance and Sexual Activity   Alcohol use: Not Currently   Drug use: Not on file   Sexual activity: Not on file  Other Topics Concern   Not on file  Social History Narrative   Not on file   Social Determinants of Health   Financial Resource Strain: Not on file  Food Insecurity: Not on file  Transportation Needs: Not on file  Physical Activity: Not on file  Stress: Not on file  Social Connections: Not on file  Intimate Partner Violence: Not on file    Review of Systems: See HPI, otherwise negative ROS  Physical Exam: BP (!) 164/87   Temp 98.1 F (36.7 C) (Temporal)   Resp 18   Ht 5\' 11"  (1.803 m)   Wt 51.2 kg   SpO2 98%   BMI 15.75 kg/m  General:   Alert, cooperative in NAD Head:  Normocephalic and atraumatic. Respiratory:  Normal work of breathing. Cardiovascular:  RRR  Impression/Plan: Caleb Townsend is here for cataract surgery.  Risks, benefits, limitations, and alternatives regarding cataract surgery have been reviewed with the patient.   Questions have been answered.  All parties agreeable.   Willey Blade, MD  12/18/2022, 11:48 AM

## 2022-12-18 NOTE — Op Note (Signed)
OPERATIVE NOTE  Caleb Townsend [id number] 12/18/2022   PREOPERATIVE DIAGNOSIS:  Nuclear sclerotic cataract right eye.  H25.11   POSTOPERATIVE DIAGNOSIS:    Nuclear sclerotic cataract right eye.     PROCEDURE:  Phacoemusification with posterior chamber intraocular lens placement of the right eye   LENS:   Implant Name Type Inv. Item Serial No. Manufacturer Lot No. LRB No. Used Action  LENS IOL EYHANCE TORIC   21.5 - [id number]  LENS IOL EYHANCE TORIC   21.5 [id number] SIGHTPATH  Right 1 Implanted       Procedure(s): CATARACT EXTRACTION PHACO AND INTRAOCULAR LENS PLACEMENT (IOC) RIGHT EYHANCE  TORIC LENS  2.68  00:25.3 (Right)  [id number] +21.5   ULTRASOUND TIME: 0 minutes 25 seconds.  CDE 2.68   SURGEON:  Willey Blade, MD, MPH  ANESTHESIOLOGIST: Anesthesiologist: Marisue Humble, MD CRNA: [place], [place], CRNA   ANESTHESIA:  Topical with tetracaine drops augmented with 1% preservative-free intracameral lidocaine.  ESTIMATED BLOOD LOSS: less than 1 mL.   COMPLICATIONS:  None.   DESCRIPTION OF PROCEDURE:  The patient was identified in the holding room and transported to the operating room and placed in the supine position under the operating microscope.  The right eye was identified as the operative eye and it was prepped and draped in the usual sterile ophthalmic fashion.  The verion system was registered without difficulty.   A 1.0 millimeter clear-corneal paracentesis was made at the 10:30 position. 0.5 ml of preservative-free 1% lidocaine with epinephrine was injected into the anterior chamber.  The anterior chamber was filled with viscoelastic.  A 2.4 millimeter keratome was used to make a near-clear corneal incision at the 8:00 position.  A curvilinear capsulorrhexis was made with a cystotome and capsulorrhexis forceps.  Balanced salt solution was used to hydrodissect and hydrodelineate the nucleus.   Phacoemulsification was then used in stop and chop fashion to remove the  lens nucleus and epinucleus.  The remaining cortex was then removed using the irrigation and aspiration handpiece. Viscoelastic was then placed into the capsular bag to distend it for lens placement.  A lens was then injected into the capsular bag.  The remaining viscoelastic was aspirated.  The lens was rotated with guidance from the verion system.   Wounds were hydrated with balanced salt solution.  The anterior chamber was inflated to a physiologic pressure with balanced salt solution. The lens was well centered.  Intracameral vigamox 0.1 mL undiluted was injected into the eye and a drop placed onto the ocular surface.  No wound leaks were noted.  The patient was taken to the recovery room in stable condition without complications of anesthesia or surgery  Willey Blade 12/18/2022, 12:29 PM

## 2022-12-18 NOTE — Transfer of Care (Signed)
Immediate Anesthesia Transfer of Care Note  Patient: Caleb Townsend  Procedure(s) Performed: CATARACT EXTRACTION PHACO AND INTRAOCULAR LENS PLACEMENT (IOC) RIGHT EYHANCE  TORIC LENS  2.68  00:25.3 (Right: Eye)  Patient Location: PACU  Anesthesia Type: No value filed.  Level of Consciousness: awake, alert  and patient cooperative  Airway and Oxygen Therapy: Patient Spontanous Breathing and Patient connected to supplemental oxygen  Post-op Assessment: Post-op Vital signs reviewed, Patient's Cardiovascular Status Stable, Respiratory Function Stable, Patent Airway and No signs of Nausea or vomiting  Post-op Vital Signs: Reviewed and stable  Complications: No notable events documented.

## 2022-12-19 ENCOUNTER — Encounter: Payer: Self-pay | Admitting: Ophthalmology

## 2022-12-20 ENCOUNTER — Encounter: Payer: Self-pay | Admitting: Ophthalmology

## 2022-12-27 NOTE — Discharge Instructions (Signed)

## 2023-01-01 ENCOUNTER — Ambulatory Visit
Admission: RE | Admit: 2023-01-01 | Discharge: 2023-01-01 | Disposition: A | Discharge status: Home / Self Care | Payer: Medicare Other | Attending: Ophthalmology | Admitting: Ophthalmology

## 2023-01-01 ENCOUNTER — Other Ambulatory Visit: Payer: Self-pay

## 2023-01-01 ENCOUNTER — Encounter
Admission: RE | Disposition: A | Discharge status: Home / Self Care | Payer: Self-pay | Source: Home / Self Care | Attending: Ophthalmology

## 2023-01-01 ENCOUNTER — Encounter: Payer: Self-pay | Admitting: Ophthalmology

## 2023-01-01 ENCOUNTER — Ambulatory Visit: Payer: Medicare Other | Admitting: Anesthesiology

## 2023-01-01 DIAGNOSIS — Z942 Lung transplant status: Secondary | ICD-10-CM | POA: Diagnosis not present

## 2023-01-01 DIAGNOSIS — E039 Hypothyroidism, unspecified: Secondary | ICD-10-CM | POA: Insufficient documentation

## 2023-01-01 DIAGNOSIS — H2512 Age-related nuclear cataract, left eye: Secondary | ICD-10-CM | POA: Insufficient documentation

## 2023-01-01 DIAGNOSIS — Z87891 Personal history of nicotine dependence: Secondary | ICD-10-CM | POA: Diagnosis not present

## 2023-01-01 DIAGNOSIS — K449 Diaphragmatic hernia without obstruction or gangrene: Secondary | ICD-10-CM | POA: Diagnosis not present

## 2023-01-01 HISTORY — PX: CATARACT EXTRACTION W/PHACO: SHX586

## 2023-01-01 SURGERY — PHACOEMULSIFICATION, CATARACT, WITH IOL INSERTION
Anesthesia: Monitor Anesthesia Care | Site: Eye | Laterality: Left

## 2023-01-01 MED ORDER — SIGHTPATH DOSE#1 BSS IO SOLN
INTRAOCULAR | Status: DC | PRN
  Administered 2023-01-01: 82 mL via OPHTHALMIC

## 2023-01-01 MED ORDER — TETRACAINE HCL 0.5 % OP SOLN
1.0000 [drp] | OPHTHALMIC | Status: DC | PRN
  Administered 2023-01-01 (×3): 1 [drp] via OPHTHALMIC

## 2023-01-01 MED ORDER — LIDOCAINE HCL (PF) 2 % IJ SOLN
INTRAOCULAR | Status: DC | PRN
  Administered 2023-01-01: 1 mL via INTRAOCULAR

## 2023-01-01 MED ORDER — SIGHTPATH DOSE#1 BSS IO SOLN
INTRAOCULAR | Status: DC | PRN
  Administered 2023-01-01: 15 mL

## 2023-01-01 MED ORDER — SIGHTPATH DOSE#1 NA HYALUR & NA CHOND-NA HYALUR IO KIT
PACK | INTRAOCULAR | Status: DC | PRN
  Administered 2023-01-01: 1 via OPHTHALMIC

## 2023-01-01 MED ORDER — MOXIFLOXACIN HCL 0.5 % OP SOLN
OPHTHALMIC | Status: DC | PRN
  Administered 2023-01-01: .2 mL via OPHTHALMIC

## 2023-01-01 MED ORDER — ARMC OPHTHALMIC DILATING DROPS
1.0000 | OPHTHALMIC | Status: DC | PRN
  Administered 2023-01-01 (×3): 1 via OPHTHALMIC

## 2023-01-01 MED ORDER — MIDAZOLAM HCL 2 MG/2ML IJ SOLN
INTRAMUSCULAR | Status: DC | PRN
  Administered 2023-01-01: 2 mg via INTRAVENOUS

## 2023-01-01 MED ORDER — LACTATED RINGERS IV SOLN: INTRAVENOUS | Status: DC

## 2023-01-01 SURGICAL SUPPLY — 18 items
CANNULA ANT/CHMB 27G (MISCELLANEOUS) IMPLANT
CANNULA ANT/CHMB 27GA (MISCELLANEOUS) IMPLANT
CATARACT SUITE SIGHTPATH (MISCELLANEOUS) ×1 IMPLANT
DISSECTOR HYDRO NUCLEUS 50X22 (MISCELLANEOUS) ×1 IMPLANT
FEE CATARACT SUITE SIGHTPATH (MISCELLANEOUS) ×1 IMPLANT
GLOVE SURG GAMMEX PI TX LF 7.5 (GLOVE) ×1 IMPLANT
GLOVE SURG SYN 8.5  E (GLOVE) ×1
GLOVE SURG SYN 8.5 E (GLOVE) ×1 IMPLANT
GLOVE SURG SYN 8.5 PF PI (GLOVE) ×1 IMPLANT
LENS IOL TECNIS EYHANCE 23.0 (Intraocular Lens) IMPLANT
NDL FILTER BLUNT 18X1 1/2 (NEEDLE) ×1 IMPLANT
NEEDLE FILTER BLUNT 18X1 1/2 (NEEDLE) ×1 IMPLANT
PACK VIT ANT 23G (MISCELLANEOUS) IMPLANT
RING MALYGIN (MISCELLANEOUS) IMPLANT
SUT ETHILON 10-0 CS-B-6CS-B-6 (SUTURE)
SUTURE EHLN 10-0 CS-B-6CS-B-6 (SUTURE) IMPLANT
SYR 3ML LL SCALE MARK (SYRINGE) ×1 IMPLANT
SYR 5ML LL (SYRINGE) ×1 IMPLANT

## 2023-01-01 NOTE — Anesthesia Postprocedure Evaluation (Signed)
Anesthesia Post Note  Patient: Caleb Townsend  Procedure(s) Performed: CATARACT EXTRACTION PHACO AND INTRAOCULAR LENS PLACEMENT (IOC) LEFT (Left: Eye)  Patient location during evaluation: PACU Anesthesia Type: MAC Level of consciousness: awake and alert Pain management: pain level controlled Vital Signs Assessment: post-procedure vital signs reviewed and stable Respiratory status: spontaneous breathing, nonlabored ventilation, respiratory function stable and patient connected to nasal cannula oxygen Cardiovascular status: stable and blood pressure returned to baseline Postop Assessment: no apparent nausea or vomiting Anesthetic complications: no   No notable events documented.   Last Vitals:  Vitals:   01/01/23 1006 01/01/23 1011  BP: 125/79 128/80  Pulse: 69 (!) 53  Resp: 14 19  Temp: 36.5 C 36.5 C  SpO2: 96% 99%    Last Pain:  Vitals:   01/01/23 1011  TempSrc:   PainSc: 0-No pain                 Omaya Nieland C Lacretia Tindall

## 2023-01-01 NOTE — Anesthesia Preprocedure Evaluation (Signed)
Anesthesia Evaluation  Patient identified by MRN, date of birth, ID band Patient awake    Reviewed: Allergy & Precautions, H&P , NPO status , Patient's Chart, lab work & pertinent test results  Airway Mallampati: III  TM Distance: >3 FB Neck ROM: Full    Dental no notable dental hx.    Pulmonary neg pulmonary ROS, former smoker Hx lung transplant, doing well now, "I do whatever I want to do, just slower"   Pulmonary exam normal breath sounds clear to auscultation       Cardiovascular negative cardio ROS Normal cardiovascular exam Rhythm:Regular Rate:Normal     Neuro/Psych negative neurological ROS  negative psych ROS   GI/Hepatic Neg liver ROS, hiatal hernia,GERD  ,,  Endo/Other  Hypothyroidism    Renal/GU negative Renal ROS  negative genitourinary   Musculoskeletal negative musculoskeletal ROS (+)    Abdominal   Peds negative pediatric ROS (+)  Hematology negative hematology ROS (+)   Anesthesia Other Findings Vertigo GERD (gastroesophageal reflux disease) Prostate cancer  Pulmonary fibrosis Hypothyroidism  History of hiatal hernia S/P lung transplant     Reproductive/Obstetrics negative OB ROS                             Anesthesia Physical Anesthesia Plan  ASA: 3  Anesthesia Plan: MAC   Post-op Pain Management:    Induction: Intravenous  PONV Risk Score and Plan:   Airway Management Planned: Natural Airway and Nasal Cannula  Additional Equipment:   Intra-op Plan:   Post-operative Plan:   Informed Consent: I have reviewed the patients History and Physical, chart, labs and discussed the procedure including the risks, benefits and alternatives for the proposed anesthesia with the patient or authorized representative who has indicated his/her understanding and acceptance.     Dental Advisory Given  Plan Discussed with: Anesthesiologist, CRNA and  Surgeon  Anesthesia Plan Comments: (Patient consented for risks of anesthesia including but not limited to:  - adverse reactions to medications - damage to eyes, teeth, lips or other oral mucosa - nerve damage due to positioning  - sore throat or hoarseness - Damage to heart, brain, nerves, lungs, other parts of body or loss of life  Patient voiced understanding.)       Anesthesia Quick Evaluation

## 2023-01-01 NOTE — H&P (Signed)
[facility]   Primary Care Physician:  Ralph Dowdy, DO Ophthalmologist: Dr. Willey Blade  Pre-Procedure History & Physical: HPI:  Caleb Townsend is a 72 y.o. male here for cataract surgery.   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 lumbar   BRONCHOSCOPY     CATARACT EXTRACTION W/PHACO Right 12/18/2022   Procedure: CATARACT EXTRACTION PHACO AND INTRAOCULAR LENS PLACEMENT (IOC) RIGHT EYHANCE  TORIC LENS  2.68  00:25.3;  Surgeon: Nevada Crane, MD;  Location: [facility];  Service: Ophthalmology;  Laterality: Right;   GREATER OMENTAL FLAP CLOSURE  03/10/2019   LARYNGOSCOPY  01/31/2019   LUNG TRANSPLANT Bilateral 01/22/2019   TONSILLECTOMY  1956   TOTAL HIP ARTHROPLASTY Right 2013    Prior to Admission medications   Medication Sig Start Date End Date Taking? Authorizing Provider  ASPIRIN 81 PO Take by mouth daily with breakfast.   Yes [provider]  azaTHIOprine (IMURAN) 50 MG tablet Take 50 mg by mouth daily with supper.   Yes [provider]  dapsone 100 MG tablet Take 100 mg by mouth daily with breakfast.   Yes [provider]  finasteride (PROSCAR) 5 MG tablet Take 5 mg by mouth at bedtime.   Yes [provider]  gabapentin (NEURONTIN) 100 MG capsule Take 100 mg by mouth 2 (two) times daily. 200 mg with breakfast, 100 mg at bedtime   Yes [provider]  levothyroxine (SYNTHROID) 50 MCG tablet Take 50 mcg by mouth daily before breakfast.   Yes [provider]  MAGNESIUM PO Take 266 mg by mouth 2 (two) times daily.   Yes [provider]  melatonin 5 MG TABS Take 5 mg by mouth at bedtime as needed.   Yes [provider]  prednisoLONE 5 MG TABS tablet Take 5 mg by mouth daily with breakfast.   Yes [provider]  tacrolimus (PROGRAF) 1 MG capsule Take 2 mg by mouth 2 (two) times daily.   Yes [provider]  HYDROmorphone (DILAUDID) 2 MG tablet Take by mouth every 6 (six) hours as needed for severe pain. Patient not taking: Reported on 12/06/2022    [provider]  levocetirizine (XYZAL) 5 MG tablet Take 5 mg by mouth daily as needed for allergies. Patient not taking: Reported on 12/06/2022    [provider]    Allergies as of 11/15/2022   (Not on File)    History reviewed. No pertinent family history.  Social History   Socioeconomic History   Marital status: Married    Spouse name: Not on file   Number of children: Not on file   Years of education: Not on file   Highest education level: Not on file  Occupational History   Not on file  Tobacco Use   Smoking status: Former    Packs/day: 1.00    Years: 30.00    Additional pack years: 0.00    Total pack years: 30.00    Types: Cigarettes    Quit date: 09/12/2007    Years since quitting: 15.3   Smokeless tobacco: Former    Quit date:  09/11/2012  Vaping Use   Vaping Use: Never used  Substance and Sexual Activity   Alcohol use: Not Currently   Drug use: Not on file   Sexual activity: Not on file  Other Topics Concern   Not on file  Social History Narrative   Not on file   Social Determinants of Health   Financial Resource Strain: Not on file  Food Insecurity: Not on file  Transportation Needs: Not on file  Physical Activity: Not on file  Stress: Not on file  Social Connections: Not on file  Intimate Partner Violence: Not on file    Review of Systems: See HPI, otherwise negative ROS  Physical Exam: BP (!) 140/79   Pulse 60   Temp 98.1 F (36.7 C) (Temporal)   Resp 19   Ht  (1.803 m)   Wt 58.5 kg   SpO2 96%   BMI 17.99 kg/m  General:   Alert, cooperative in NAD Head:   Normocephalic and atraumatic. Respiratory:  Normal work of breathing. Cardiovascular:  RRR  Impression/Plan: Caleb Townsend is here for cataract surgery.  Risks, benefits, limitations, and alternatives regarding cataract surgery have been reviewed with the patient.  Questions have been answered.  All parties agreeable.   Willey Blade, MD  01/01/2023, 9:42 AM

## 2023-01-01 NOTE — Op Note (Signed)
OPERATIVE NOTE  Caleb Townsend [id number] 01/01/2023   PREOPERATIVE DIAGNOSIS:  Nuclear sclerotic cataract left eye.  H25.12   POSTOPERATIVE DIAGNOSIS:    Nuclear sclerotic cataract left eye.     PROCEDURE:  Phacoemusification with posterior chamber intraocular lens placement of the left eye   LENS:   Implant Name Type Inv. Item Serial No. Manufacturer Lot No. LRB No. Used Action  LENS IOL TECNIS EYHANCE 23.0 - [id number] Intraocular Lens LENS IOL TECNIS EYHANCE 23.0 [id number] [employer]IGHTPATH  Left 1 Implanted      Procedure(s) with comments: CATARACT EXTRACTION PHACO AND INTRAOCULAR LENS PLACEMENT (IOC) LEFT (Left) - 3.82 0:32.3  DIB00 +23.0   ULTRASOUND TIME: 0 minutes 32 seconds.  CDE 3.82   SURGEON:  Willey Blade, MD, MPH   ANESTHESIA:  Topical with tetracaine drops augmented with 1% preservative-free intracameral lidocaine.  ESTIMATED BLOOD LOSS: <1 mL   COMPLICATIONS:  None.   DESCRIPTION OF PROCEDURE:  The patient was identified in the holding room and transported to the operating room and placed in the supine position under the operating microscope.  The left eye was identified as the operative eye and it was prepped and draped in the usual sterile ophthalmic fashion.   A 1.0 millimeter clear-corneal paracentesis was made at the 5:00 position. 0.5 ml of preservative-free 1% lidocaine with epinephrine was injected into the anterior chamber.  The anterior chamber was filled with viscoelastic.  A 2.4 millimeter keratome was used to make a near-clear corneal incision at the 2:00 position.  A curvilinear capsulorrhexis was made with a cystotome and capsulorrhexis forceps.  Balanced salt solution was used to hydrodissect and hydrodelineate the nucleus.   Phacoemulsification was then used in stop and chop fashion to remove the lens nucleus and epinucleus.  The remaining cortex was then removed using the irrigation and aspiration handpiece. Viscoelastic was then placed into the  capsular bag to distend it for lens placement.  A lens was then injected into the capsular bag.  The remaining viscoelastic was aspirated.   Wounds were hydrated with balanced salt solution.  The anterior chamber was inflated to a physiologic pressure with balanced salt solution.  Intracameral vigamox 0.1 mL undiltued was injected into the eye and a drop placed onto the ocular surface.  No wound leaks were noted.  The patient was taken to the recovery room in stable condition without complications of anesthesia or surgery  Willey Blade 01/01/2023, 10:05 AM

## 2023-01-01 NOTE — Transfer of Care (Signed)
Immediate Anesthesia Transfer of Care Note  Patient: Caleb Townsend  Procedure(s) Performed: CATARACT EXTRACTION PHACO AND INTRAOCULAR LENS PLACEMENT (IOC) LEFT (Left: Eye)  Patient Location: PACU  Anesthesia Type: MAC  Level of Consciousness: awake, alert  and patient cooperative  Airway and Oxygen Therapy: Patient Spontanous Breathing and Patient connected to supplemental oxygen  Post-op Assessment: Post-op Vital signs reviewed, Patient's Cardiovascular Status Stable, Respiratory Function Stable, Patent Airway and No signs of Nausea or vomiting  Post-op Vital Signs: Reviewed and stable  Complications: No notable events documented.

## 2023-10-13 IMAGING — CT CT ABD-PELV W/O CM
1 of 2 series · 15 of 32 positions shown, 19 images · non-contrast
Comparison: None.

CLINICAL DATA: Hernia post omental flap and lung transplant surgery

EXAM:
CT ABDOMEN AND PELVIS WITHOUT CONTRAST
TECHNIQUE: Multidetector CT imaging of the abdomen and pelvis was performed
following the standard protocol without IV contrast.

[Series 2: axial st · axial · 0.71mm/px · z∈[-610,-205]mm · 15 of 89 slices shown, 19 images]
[im 4/89  soft-tissue]
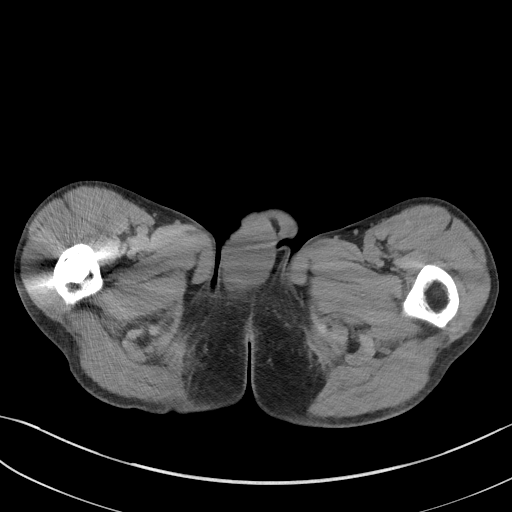
[im 4/89  bone]
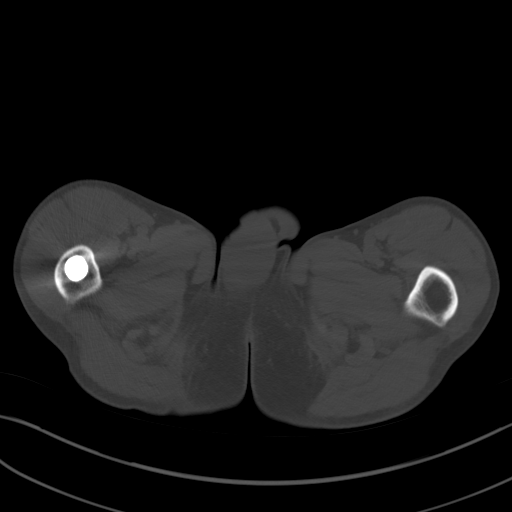
[im 12/89  soft-tissue]
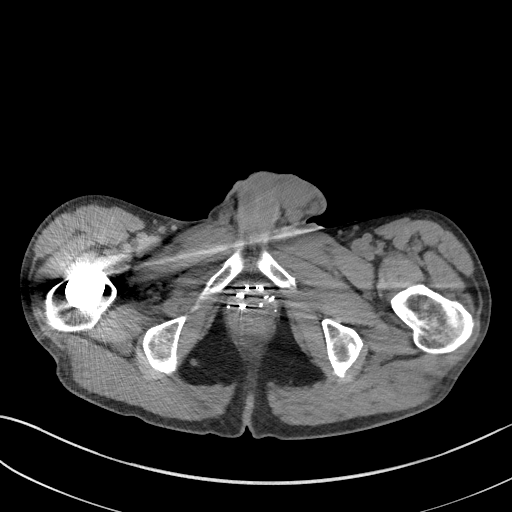
[im 19/89  soft-tissue]
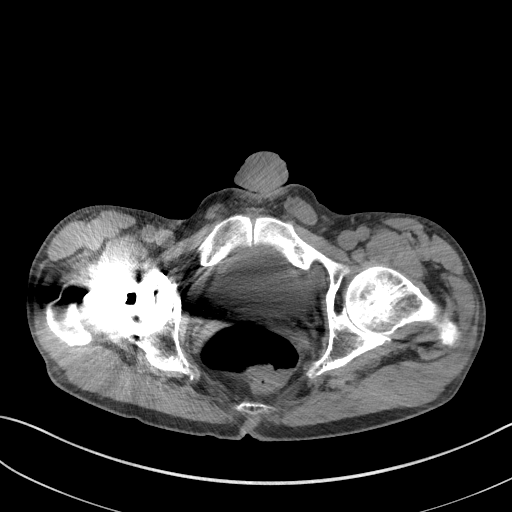
[im 26/89  soft-tissue]
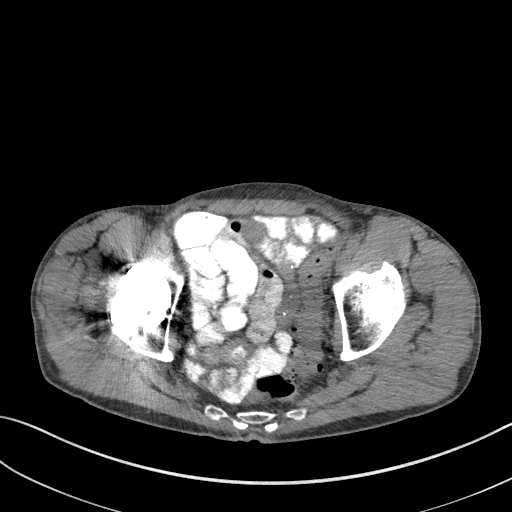
[im 30/89  soft-tissue]
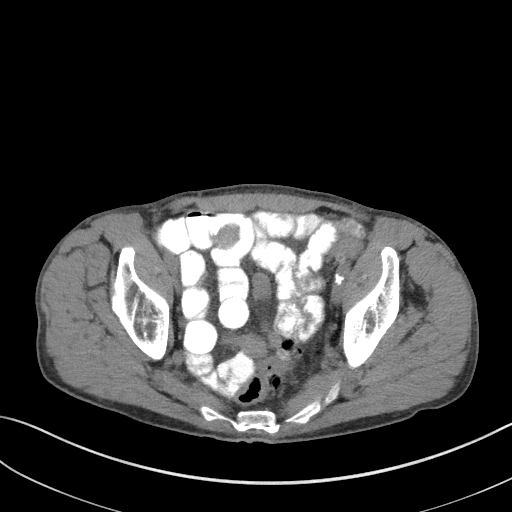
[im 37/89  soft-tissue]
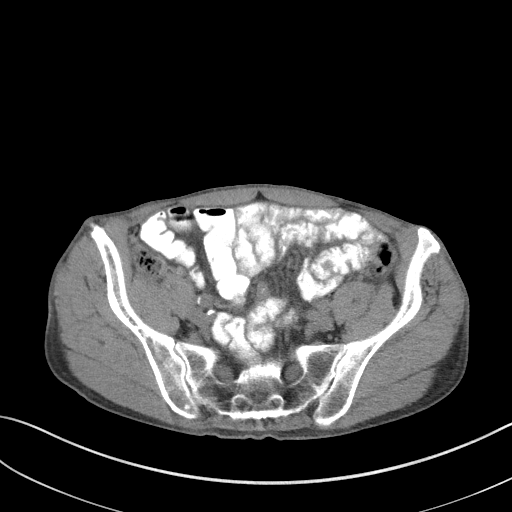
[im 45/89  soft-tissue]
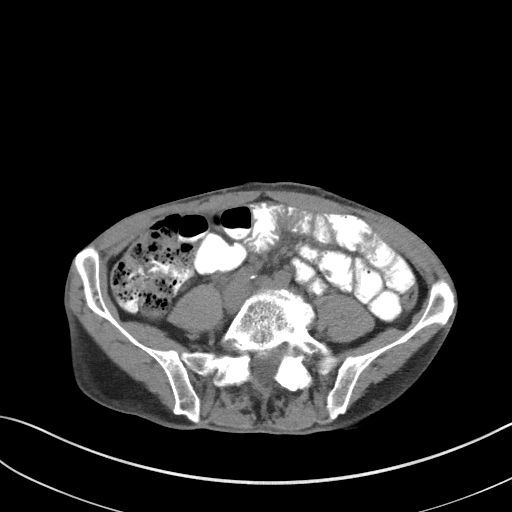
[im 52/89  soft-tissue]
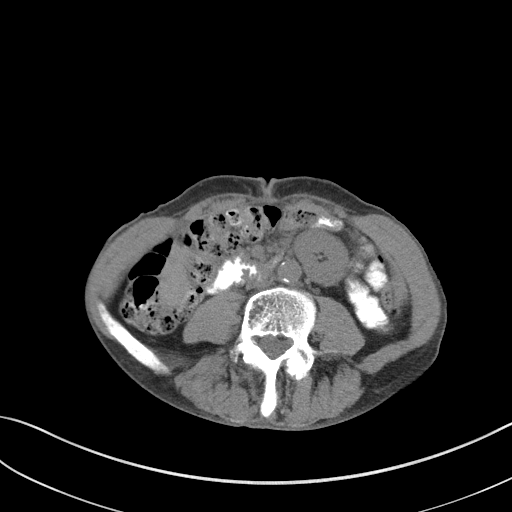
[im 59/89  soft-tissue]
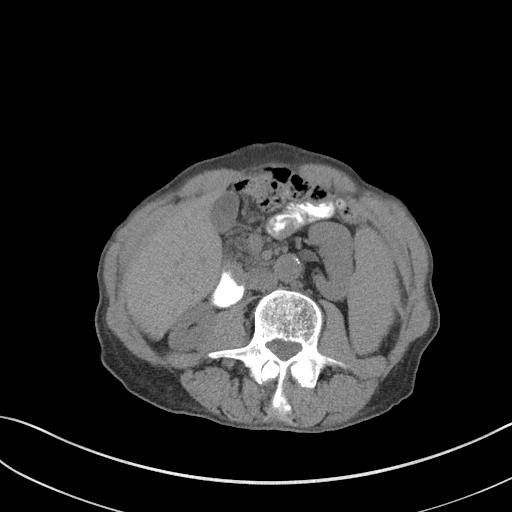
[im 59/89  bone]
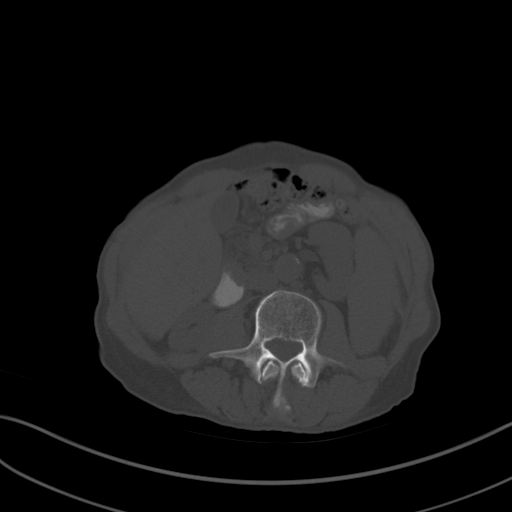
[im 63/89  soft-tissue]
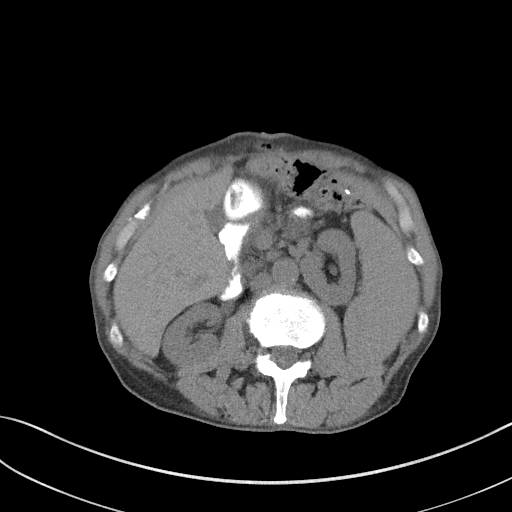
[im 70/89  soft-tissue]
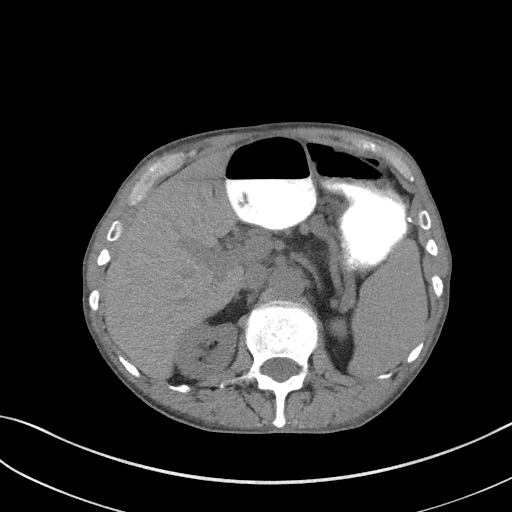
[im 74/89  lung]
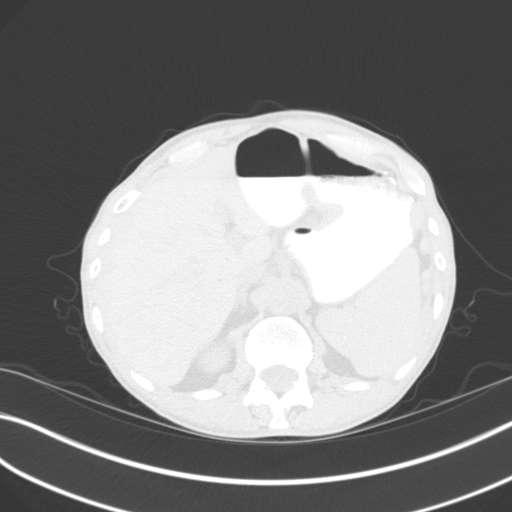
[im 78/89  soft-tissue]
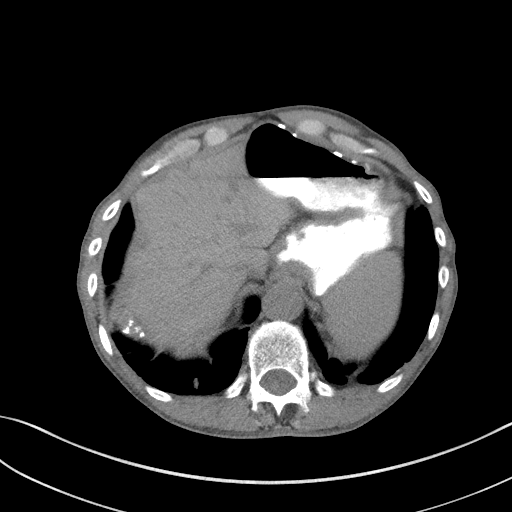
[im 78/89  lung]
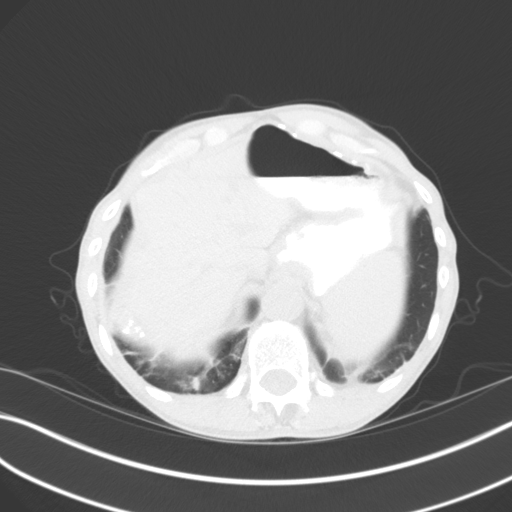
[im 81/89  lung]
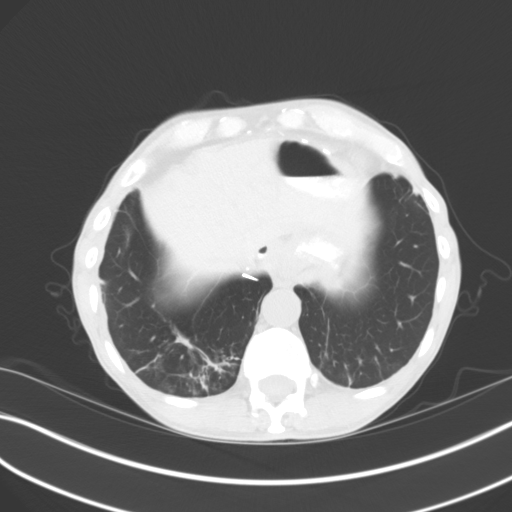
[im 85/89  soft-tissue]
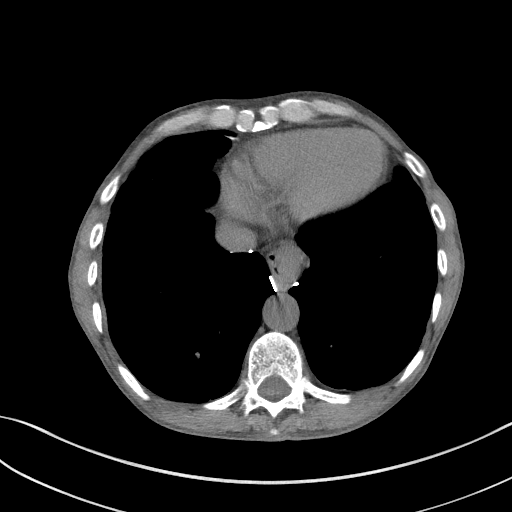
[im 85/89  lung]
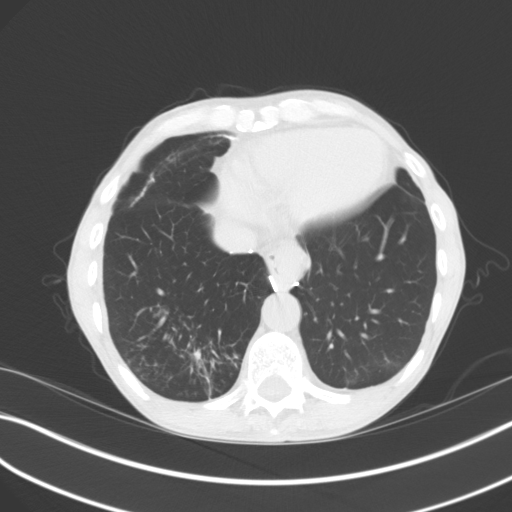

[15 of 32 positions shown; findings below may reference images not displayed]

FINDINGS: Lower chest: No pleural or pericardial effusion. Surgical staple
lines and clips in both lung bases. Nodular scarring or atelectasis
with coarse calcifications at the right lung base.

Hepatobiliary: No focal liver abnormality is seen. No gallstones,
gallbladder wall thickening, or biliary dilatation.

Pancreas: Unremarkable. No pancreatic ductal dilatation or
surrounding inflammatory changes.

Spleen: Borderline splenomegaly, 13 cm craniocaudal length, without
focal lesion.

Adrenals/Urinary Tract: Adrenal glands are unremarkable. Kidneys are
normal, without renal calculi, focal lesion, or hydronephrosis.
Bladder is unremarkable.

Stomach/Bowel: Surgical staples along the greater curvature of the
stomach which is physiologically distended by contrast. Small bowel
is nondilated with good distal passage of oral contrast material.
Normal appendix. The colon is nondilated. Anterior wall of the mid
transverse colon extends into a midline epigastric ventral hernia,
without evidence of strangulation or obstruction. Scattered sigmoid
diverticula without significant adjacent inflammatory change.

Vascular/Lymphatic: Scattered aortoiliac calcified atheromatous
plaque without aneurysm. No abdominal or pelvic adenopathy.

Reproductive: Multiple metallic seeds around the periphery of the
prostate.

Other: No ascites.  No free air.

Musculoskeletal: Right hip arthroplasty components project in
expected location. Mild lumbar levoscoliosis apex L3 with multilevel
spondylitic change. No fracture or worrisome bone lesion.
IMPRESSION: 1. Small midline epigastric ventral hernia containing anterior wall
of transverse colon, without obstruction or strangulation.
2. Sigmoid diverticulosis
3.  Aortic Atherosclerosis ([id number]).
